# Patient Record
Sex: Female | Born: 1960
Health system: Southern US, Community
[De-identification: ages and names within clinical notes are randomized; demographics above are authoritative.]

## PROBLEM LIST (undated history)

## (undated) DIAGNOSIS — E538 Deficiency of other specified B group vitamins: Secondary | ICD-10-CM

## (undated) DIAGNOSIS — L719 Rosacea, unspecified: Secondary | ICD-10-CM

## (undated) DIAGNOSIS — K224 Dyskinesia of esophagus: Secondary | ICD-10-CM

## (undated) DIAGNOSIS — F419 Anxiety disorder, unspecified: Secondary | ICD-10-CM

## (undated) DIAGNOSIS — K635 Polyp of colon: Secondary | ICD-10-CM

## (undated) DIAGNOSIS — T7840XA Allergy, unspecified, initial encounter: Secondary | ICD-10-CM

## (undated) HISTORY — DX: Polyp of colon: K63.5

## (undated) HISTORY — DX: Allergy, unspecified, initial encounter: T78.40XA

## (undated) HISTORY — DX: Anxiety disorder, unspecified: F41.9

## (undated) HISTORY — DX: Dyskinesia of esophagus: K22.4

## (undated) HISTORY — DX: Deficiency of other specified B group vitamins: E53.8

## (undated) HISTORY — DX: Rosacea, unspecified: L71.9

---

## 2000-02-29 HISTORY — PX: ANAL FISSURECTOMY: SUR608

## 2006-06-20 ENCOUNTER — Other Ambulatory Visit: Admission: RE | Admit: 2006-06-20 | Discharge: 2006-06-20 | Payer: Self-pay | Admitting: *Deleted

## 2007-06-18 ENCOUNTER — Other Ambulatory Visit: Admission: RE | Admit: 2007-06-18 | Discharge: 2007-06-18 | Payer: Self-pay | Admitting: *Deleted

## 2007-07-05 ENCOUNTER — Encounter: Admission: RE | Admit: 2007-07-05 | Discharge: 2007-07-05 | Payer: Self-pay | Admitting: *Deleted

## 2008-07-07 ENCOUNTER — Encounter: Admission: RE | Admit: 2008-07-07 | Discharge: 2008-07-07 | Payer: Self-pay | Admitting: Obstetrics and Gynecology

## 2008-07-07 LAB — HM MAMMOGRAPHY: HM Mammogram: NEGATIVE

## 2009-07-09 ENCOUNTER — Encounter: Admission: RE | Admit: 2009-07-09 | Discharge: 2009-07-09 | Payer: Self-pay | Admitting: Obstetrics and Gynecology

## 2010-06-16 ENCOUNTER — Other Ambulatory Visit: Payer: Self-pay | Admitting: Obstetrics and Gynecology

## 2010-06-16 DIAGNOSIS — Z1231 Encounter for screening mammogram for malignant neoplasm of breast: Secondary | ICD-10-CM

## 2010-07-14 ENCOUNTER — Other Ambulatory Visit: Payer: Self-pay

## 2010-07-14 ENCOUNTER — Ambulatory Visit
Admission: RE | Admit: 2010-07-14 | Discharge: 2010-07-14 | Disposition: A | Discharge status: Home / Self Care | Payer: BC Managed Care – PPO | Source: Ambulatory Visit | Attending: Obstetrics and Gynecology | Admitting: Obstetrics and Gynecology

## 2010-07-14 DIAGNOSIS — Z1231 Encounter for screening mammogram for malignant neoplasm of breast: Secondary | ICD-10-CM

## 2010-07-21 ENCOUNTER — Encounter: Payer: Self-pay | Admitting: *Deleted

## 2010-07-21 ENCOUNTER — Encounter: Payer: Self-pay | Admitting: Family Medicine

## 2010-07-21 ENCOUNTER — Ambulatory Visit (INDEPENDENT_AMBULATORY_CARE_PROVIDER_SITE_OTHER): Payer: BC Managed Care – PPO | Admitting: Family Medicine

## 2010-07-21 VITALS — BP 108/68 | HR 80 | Ht 66.0 in | Wt 125.0 lb

## 2010-07-21 DIAGNOSIS — L719 Rosacea, unspecified: Secondary | ICD-10-CM | POA: Insufficient documentation

## 2010-07-21 DIAGNOSIS — Z0000 Encounter for general adult medical examination without abnormal findings: Secondary | ICD-10-CM

## 2010-07-21 DIAGNOSIS — F17200 Nicotine dependence, unspecified, uncomplicated: Secondary | ICD-10-CM

## 2010-07-21 DIAGNOSIS — J309 Allergic rhinitis, unspecified: Secondary | ICD-10-CM | POA: Insufficient documentation

## 2010-07-21 LAB — LIPID PANEL
Cholesterol: 180 mg/dL (ref 0–200)
HDL: 50 mg/dL (ref >39)
Total CHOL/HDL Ratio: 3.6 Ratio
VLDL: 18 mg/dL (ref 0–40)

## 2010-07-21 LAB — GLUCOSE, RANDOM: Glucose, Bld: 79 mg/dL (ref 70–99)

## 2010-07-21 LAB — POCT URINALYSIS DIPSTICK
Blood, UA: NEGATIVE
Leukocytes, UA: NEGATIVE
Nitrite, UA: NEGATIVE
Protein, UA: NEGATIVE
Urobilinogen, UA: NEGATIVE

## 2010-07-21 NOTE — Progress Notes (Signed)
Subjective:    Patient ID: Lindsay Chang, female    DOB: 08/25/1960, 50 y.o.   MRN: [id number]  HPI Lindsay Chang is a 50 y.o. female who presents for a complete physical. She sees Dr. Billy Coast for her GYN care, due again in July. She has the following concerns:  Intermittent night sweats; periods will get irregular for a few months, and then back to normal, monthly again for the last 4 months (went 4 months without a cycle, associated with increased night sweats)  Would like to have lipids checked.  Hasn't eaten in 6 hours.  Had CBC, Cmet, Vitamin D, TSH in 10/2008 which was normal. No lipids done then.  FSH was elevated at the time.    Immunization History  Administered Date(s) Administered  . 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 Allergy   . Anxiety   . Rosacea     Dr. Emily Filbert    Past Surgical History  Procedure Date  . Rectal fissure     History   Social History  . Marital Status: Married    Spouse Name: N/A    Number of Children: 2  . Years of Education: N/A   Occupational History  . Yoga instructor    Social History Main Topics  . Smoking status: Current Everyday Smoker -- 0.5 packs/day  . Smokeless tobacco: Never Used  . Alcohol Use: 3.0 oz/week    5 Glasses of wine per week     a glass of wine most nights  . Drug Use: No  . Sexually Active: Yes    Birth Control/ Protection: Other-see comments     husband with vasectomy   Other Topics Concern  . Not on file   Social History Narrative  . No narrative on file    Family History  Problem Relation Age of Onset  . Breast cancer Mother   . Graves' disease Mother   . Hyperlipidemia Mother   . Hypertension Father   . Anxiety disorder Father   .  Thyroid disease Brother   . Colon cancer Maternal Grandmother   . Aortic aneurysm Maternal Grandfather   . Lung cancer Maternal Grandfather   . Diabetes Neg Hx     Current outpatient prescriptions:AVAR LS CLEANSER 10-2 % LIQD, , Disp: , Rfl: ;  cetirizine (ZYRTEC) 10 MG tablet, Take 10 mg by mouth daily.  , Disp: , Rfl: ;  FINACEA 15 % cream, , Disp: , Rfl: ;  ibuprofen (ADVIL,MOTRIN) 200 MG tablet, Take 200 mg by mouth as needed.  , Disp: , Rfl: ;  Multiple Vitamin (MULTI-VITAMIN) tablet, Take 1 tablet by mouth daily.  , Disp: , Rfl:   No Known Allergies  Review of Systems The patient denies anorexia, fever, weight changes, headaches,  vision changes, decreased hearing, ear pain, sore throat, breast concerns, chest pain, syncope, dyspnea on exertion, cough, swelling, nausea, vomiting, diarrhea, constipation, abdominal pain, melena, hematochezia, indigestion/heartburn, hematuria, incontinence, dysuria, irregular menstrual cycles, vaginal discharge, odor or itch, genital lesions, joint pains, numbness, tingling, weakness, tremor, suspicious skin lesions, depression, abnormal bleeding/bruising, or enlarged lymph nodes.  +ROS: occasional heartburn; occasionally feels dizzy. Has had low iron levels and some low BP in past. Denies recent symptoms of anemia, dizziness.  Sees dermatologist regularly for skin checks and treatment of rosacea.  Some anxiety, worries a lot.  Never took medications for anxiety.  Has some palpitations related to anxiety with a negative w/u for palpitations in the past    Objective:   Physical Exam BP 108/68  Pulse 80  Ht 5\' 6"  (1.676 m)  Wt 125 lb (56.7 kg)  BMI 20.18 kg/m2  LMP 06/22/2010  General Appearance:    Alert, cooperative, no distress, appears stated age  Head:    Normocephalic, without obvious abnormality, atraumatic  Eyes:    PERRL, conjunctiva/corneas clear, EOM's intact, fundi    benign  Ears:    Normal TM's and external ear canals  Nose:   Nares  normal, mucosa normal, no drainage or sinus   tenderness  Throat:   Lips, mucosa, and tongue normal; teeth and gums normal  Neck:   Supple, no lymphadenopathy;  thyroid:  no   enlargement/tenderness/nodules; no carotid   bruit or JVD  Back:    Spine nontender, no curvature, ROM normal, no CVA     tenderness  Lungs:     Clear to auscultation bilaterally without wheezes, rales or     ronchi; respirations unlabored  Chest Wall:    No tenderness or deformity   Heart:    Regular rate and rhythm, S1 and S2 normal, no murmur, rub   or gallop  Breast Exam:    Deferred to GYN  Abdomen:     Soft, non-tender, nondistended, normoactive bowel sounds,    no masses, no hepatosplenomegaly  Genitalia:    Deferred to GYN     Extremities:   No clubbing, cyanosis or edema  Pulses:   2+ and symmetric all extremities  Skin:   Skin color, texture, turgor normal, no rashes or lesions  Lymph nodes:   Cervical, and supraclavicular nodes normal  Neurologic:   CNII-XII intact, normal strength, sensation and gait; reflexes 2+ and symmetric throughout          Psych:   Normal mood, affect, hygiene and grooming.           Assessment & Plan:   1. Routine general medical examination at a health care facility  POCT urinalysis dipstick, Visual acuity screening, Lipid panel, Glucose, random  2. Allergic rhinitis, cause unspecified     seasonal, controlled with Zyrtec prn  3. Tobacco use disorder      Discussed monthly self breast exams and yearly mammograms after the age of 50; at least 30 minutes of aerobic activity at least 5 days/week; proper sunscreen use reviewed; healthy diet, including goals of calcium and vitamin D intake and alcohol recommendations (less than or equal to 1 drink/day) reviewed; regular seatbelt use; changing batteries in smoke detectors.  Immunization recommendations discussed.  Colonoscopy recommendations reviewed  Patient was encouraged to quit smoking.  Discussed risks of smoking.   Instructed to start thinking about why/when patient smokes in order to come up with effective strategies to cut back or quit. Discussed available resources, including free counseling through [place] [facility], smoking cessation classes through [facility], OTC nicotine replacements, and the possibility of assistance with prescription medication if patients own strategies fail and if patient is motivated to quit.

## 2010-07-22 ENCOUNTER — Encounter: Payer: Self-pay | Admitting: Family Medicine

## 2010-11-30 ENCOUNTER — Other Ambulatory Visit (INDEPENDENT_AMBULATORY_CARE_PROVIDER_SITE_OTHER): Payer: BC Managed Care – PPO

## 2010-11-30 DIAGNOSIS — Z23 Encounter for immunization: Secondary | ICD-10-CM

## 2011-02-09 ENCOUNTER — Ambulatory Visit (INDEPENDENT_AMBULATORY_CARE_PROVIDER_SITE_OTHER): Payer: BC Managed Care – PPO | Admitting: Family Medicine

## 2011-02-09 ENCOUNTER — Encounter: Payer: Self-pay | Admitting: Family Medicine

## 2011-02-09 ENCOUNTER — Other Ambulatory Visit: Payer: Self-pay | Admitting: Family Medicine

## 2011-02-09 DIAGNOSIS — L309 Dermatitis, unspecified: Secondary | ICD-10-CM

## 2011-02-09 DIAGNOSIS — K5900 Constipation, unspecified: Secondary | ICD-10-CM

## 2011-02-09 DIAGNOSIS — K644 Residual hemorrhoidal skin tags: Secondary | ICD-10-CM

## 2011-02-09 DIAGNOSIS — L259 Unspecified contact dermatitis, unspecified cause: Secondary | ICD-10-CM

## 2011-02-09 DIAGNOSIS — F17200 Nicotine dependence, unspecified, uncomplicated: Secondary | ICD-10-CM

## 2011-02-09 DIAGNOSIS — N926 Irregular menstruation, unspecified: Secondary | ICD-10-CM

## 2011-02-09 LAB — CBC WITH DIFFERENTIAL/PLATELET
Basophils Absolute: 0 10*3/uL (ref 0.0–0.1)
Basophils Relative: 1 % (ref 0–1)
MCHC: 34.5 g/dL (ref 30.0–36.0)
MCV: 102.5 fL — ABNORMAL HIGH (ref 78.0–100.0)
Monocytes Absolute: 0.4 10*3/uL (ref 0.1–1.0)
Monocytes Relative: 8 % (ref 3–12)
Platelets: 317 10*3/uL (ref 150–400)

## 2011-02-09 LAB — TSH: TSH: 2.183 u[IU]/mL (ref 0.350–4.500)

## 2011-02-09 MED ORDER — HYDROCORTISONE 2.5 % RE CREA: TOPICAL_CREAM | RECTAL | Status: DC

## 2011-02-09 NOTE — Progress Notes (Signed)
Chief complaints:  spots on her legs from knee down, only after wearing heels and on feet for long periods of time-lasts for about 3 days. Also having digestive issues. Has some questions related to hormone levels checked by GYN-Dr.Taavon  HPI:   Spots on legs:  First noted 2 years ago, after wearing heels all night for a Christmas party.  Noted blood spots on her lower legs, bilaterally; resolved after 3 days.  Similar episode 3 months later after standing a lot in heels--noticed same spots the next day.  Spring break, skiing in [place], after wearing ski boots which were tight, noticed similar spots, along with discomfort, and ankle swelling.  This also cleared up within a few days.  Has now occurred  3 times in the last 2 months.  Felt legs getting a little tingly and soreness in her gastroc muscles at the same time that the rash starts.  Rash never worsens after first seeing it in the morning, and lasts for 2 days.  She mentioned to Dr. Emily Filbert who suggested pigmented purpura or vasculitis (she showed her a picture of the rash she took with her phone--she no longer has this picture available for me to see).  This is driving her nuts, and wants to find out what it is.  GI changes in the last 2-3 weeks.  Alternating between constipation and frequent stools--first of which is normal, second and/or third are looser.  Hemorrhoids have been flaring, bleeding.  Not eating breakfast like she used to, and dinner is very rushed--more fast foods, not as much vegetables in her diet.  Also used to have Activia every morning, and no longer is eating that.  Preparation H helps for hemorrhoids, and suppositories and seems to work okay, but it seems to take a while to help.  Dr. Billy Coast had hormone levels checked, and FSH was reportedly 50.  Hadn't had a period since May, until 2 weeks ago.  Had regular periods from January through May; [facility] was checked in May, elevated, and no period again until 2 weeks ago.  Felt just like  a regular period with cramps and clots, lasted 5 days, and no bleeding since.   Past Medical History  Diagnosis Date  . Allergy   . Anxiety   . Rosacea     Dr. Emily Filbert    Past Surgical History  Procedure Date  . Rectal fissure     History   Social History  . Marital Status: Married    Spouse Name: N/A    Number of Children: 2  . Years of Education: N/A   Occupational History  . Yoga instructor    Social History Main Topics  . Smoking status: Current Everyday Smoker -- 0.3 packs/day  . Smokeless tobacco: Never Used   Comment: quit date of Christmas cruise (using e-cigarette)  . Alcohol Use: 3.0 oz/week    5 Glasses of wine per week     a glass of wine 3-4 nights.  . Drug Use: No  . Sexually Active: Yes    Birth Control/ Protection: Other-see comments     husband with vasectomy   Other Topics Concern  . Not on file   Social History Narrative   Wadie Lessen daughter    Family History  Problem Relation Age of Onset  . Breast cancer Mother   . Graves' disease Mother   . Hyperlipidemia Mother   . Hypertension Father   . Anxiety disorder Father   . Thyroid disease Brother   .  Colon cancer Maternal Grandmother   . Aortic aneurysm Maternal Grandfather   . Lung cancer Maternal Grandfather   . Diabetes Neg Hx    Current Outpatient Prescriptions on File Prior to Visit  Medication Sig Dispense Refill  . AVAR LS CLEANSER 10-2 % LIQD       . FINACEA 15 % cream       . ibuprofen (ADVIL,MOTRIN) 200 MG tablet Take 200 mg by mouth as needed.        . Multiple Vitamin (MULTI-VITAMIN) tablet Take 1 tablet by mouth daily.        . cetirizine (ZYRTEC) 10 MG tablet Take 10 mg by mouth daily.         No Known Allergies  ROS: Denies fevers, URI symptoms, cough, shortness of breath, chest pain, joint pains, other skin rashes.  No urinary complaints.  See HPI  PHYSICAL EXAM: BP 102/62  Pulse 68  Ht 5\' 6"  (1.676 m)  Wt 123 lb (55.792 kg)  BMI 19.85 kg/m2  LMP  01/26/2011 Well developed, pleasant female in no distress Neck: no lymphadenopathy or thyromegaly Heart: regular rate and rhythm without murmur Lungs: clear bilaterally Abdomen: soft, nontender, no organomegaly or mass Extremities:  No clubbing, cyanosis or edema.  2+ pulses.  No skin lesions or rash Psych: normal mood, affect, hygiene and grooming  ASSESSMENT/PLAN: 1. Dermatitis  CBC with Differential, Sedimentation rate, Antinuclear Antib (ANA)  2. External hemorrhoid  hydrocortisone (ANUSOL-HC) 2.5 % rectal cream  3. Constipation  TSH  4. Irregular menses  FSH   Rash--? Etiology.  Per Dr. Emily Filbert who saw picture of it from patient's phone, possible pigmented purpura (pt looked up and said those spots were much larger than hers) or vasculitis.  Check ANA and sed rate. Also check TSH given constipation and rash.  Constipation--go back to Activia or other probiotic.  Work on high [employer], and ensure drinking adequate water  Irregular menses, with high FSH.  Check FSH again today.  If remains high, and has had ongoing bleeding, she needs to f/u with Dr. Billy Coast for possible ultrasound and/or biopsy  Tobacco use--strongly encouraged tobacco cessation.     SEND COPIES OF LABS TO DR Billy Coast AND DR Emily Filbert  F/u prn

## 2011-02-09 NOTE — Patient Instructions (Signed)
Quit smoking. Restart Activia or other probiotic High fiber diet (either through fiber cereals, bars, fruits, vegetables or other fiber supplements).  Ensure you're drinking plenty of water.  Follow up with Dr. Emily Filbert if/when rash recurs if we aren't able to make any diagnosis through bloodwork

## 2011-02-10 ENCOUNTER — Other Ambulatory Visit: Payer: Self-pay | Admitting: *Deleted

## 2011-02-10 ENCOUNTER — Encounter: Payer: Self-pay | Admitting: Family Medicine

## 2011-02-10 DIAGNOSIS — D510 Vitamin B12 deficiency anemia due to intrinsic factor deficiency: Secondary | ICD-10-CM | POA: Insufficient documentation

## 2011-02-10 DIAGNOSIS — E538 Deficiency of other specified B group vitamins: Secondary | ICD-10-CM

## 2011-02-10 DIAGNOSIS — R768 Other specified abnormal immunological findings in serum: Secondary | ICD-10-CM | POA: Insufficient documentation

## 2011-02-10 LAB — FOLATE: Folate: >20 ng/mL

## 2011-02-10 LAB — ANTI-NUCLEAR AB-TITER (ANA TITER)

## 2011-02-11 ENCOUNTER — Other Ambulatory Visit: Payer: BC Managed Care – PPO

## 2011-02-11 DIAGNOSIS — E538 Deficiency of other specified B group vitamins: Secondary | ICD-10-CM

## 2011-02-11 MED ORDER — CYANOCOBALAMIN 1000 MCG/ML IJ SOLN
1000.0000 ug | Freq: Once | INTRAMUSCULAR | Status: AC
  Administered 2011-02-11: 1000 ug via INTRAMUSCULAR

## 2011-02-18 ENCOUNTER — Other Ambulatory Visit: Payer: BC Managed Care – PPO

## 2011-02-18 DIAGNOSIS — E538 Deficiency of other specified B group vitamins: Secondary | ICD-10-CM

## 2011-02-18 MED ORDER — CYANOCOBALAMIN 1000 MCG/ML IJ SOLN
1000.0000 ug | Freq: Once | INTRAMUSCULAR | Status: AC
  Administered 2011-02-18: 1000 ug via INTRAMUSCULAR

## 2011-03-04 ENCOUNTER — Other Ambulatory Visit (INDEPENDENT_AMBULATORY_CARE_PROVIDER_SITE_OTHER): Payer: BC Managed Care – PPO

## 2011-03-04 DIAGNOSIS — E538 Deficiency of other specified B group vitamins: Secondary | ICD-10-CM

## 2011-03-04 MED ORDER — CYANOCOBALAMIN 1000 MCG/ML IJ SOLN
1000.0000 ug | Freq: Once | INTRAMUSCULAR | Status: AC
  Administered 2011-03-04: 1000 ug via INTRAMUSCULAR

## 2011-03-11 ENCOUNTER — Other Ambulatory Visit (INDEPENDENT_AMBULATORY_CARE_PROVIDER_SITE_OTHER): Payer: BC Managed Care – PPO

## 2011-03-11 ENCOUNTER — Other Ambulatory Visit: Payer: BC Managed Care – PPO

## 2011-03-11 DIAGNOSIS — E538 Deficiency of other specified B group vitamins: Secondary | ICD-10-CM

## 2011-03-11 MED ORDER — CYANOCOBALAMIN 1000 MCG/ML IJ SOLN
1000.0000 ug | Freq: Once | INTRAMUSCULAR | Status: AC
  Administered 2011-03-11: 1000 ug via INTRAMUSCULAR

## 2011-03-18 ENCOUNTER — Other Ambulatory Visit: Payer: BC Managed Care – PPO

## 2011-03-18 ENCOUNTER — Other Ambulatory Visit: Payer: Self-pay | Admitting: Family Medicine

## 2011-03-18 DIAGNOSIS — E538 Deficiency of other specified B group vitamins: Secondary | ICD-10-CM

## 2011-03-19 LAB — CBC WITH DIFFERENTIAL/PLATELET
Basophils Absolute: 0 10*3/uL (ref 0.0–0.1)
Lymphocytes Relative: 22 % (ref 12–46)
Lymphs Abs: 1.3 10*3/uL (ref 0.7–4.0)
MCH: 37.1 pg — ABNORMAL HIGH (ref 26.0–34.0)
MCHC: 35.2 g/dL (ref 30.0–36.0)
MCV: 105.5 fL — ABNORMAL HIGH (ref 78.0–100.0)
Monocytes Absolute: 0.4 10*3/uL (ref 0.1–1.0)
Neutro Abs: 4.2 10*3/uL (ref 1.7–7.7)
Platelets: 329 10*3/uL (ref 150–400)
RDW: 15.3 % (ref 11.5–15.5)

## 2011-03-21 LAB — IRON: Iron: 67 ug/dL (ref 42–145)

## 2011-03-21 LAB — FERRITIN: Ferritin: 57 ng/mL (ref 10–291)

## 2011-03-24 ENCOUNTER — Other Ambulatory Visit: Payer: Self-pay | Admitting: *Deleted

## 2011-03-24 DIAGNOSIS — D649 Anemia, unspecified: Secondary | ICD-10-CM

## 2011-03-25 ENCOUNTER — Other Ambulatory Visit: Payer: BC Managed Care – PPO

## 2011-03-25 DIAGNOSIS — E538 Deficiency of other specified B group vitamins: Secondary | ICD-10-CM

## 2011-03-25 MED ORDER — CYANOCOBALAMIN 1000 MCG/ML IJ SOLN
1000.0000 ug | Freq: Once | INTRAMUSCULAR | Status: AC
  Administered 2011-03-25: 1000 ug via INTRAMUSCULAR

## 2011-04-08 ENCOUNTER — Other Ambulatory Visit (INDEPENDENT_AMBULATORY_CARE_PROVIDER_SITE_OTHER): Payer: BC Managed Care – PPO

## 2011-04-08 DIAGNOSIS — E538 Deficiency of other specified B group vitamins: Secondary | ICD-10-CM

## 2011-04-08 MED ORDER — CYANOCOBALAMIN 1000 MCG/ML IJ SOLN
1000.0000 ug | Freq: Once | INTRAMUSCULAR | Status: AC
  Administered 2011-04-08: 1000 ug via INTRAMUSCULAR

## 2011-04-18 ENCOUNTER — Encounter: Payer: Self-pay | Admitting: Internal Medicine

## 2011-04-22 ENCOUNTER — Other Ambulatory Visit: Payer: BC Managed Care – PPO

## 2011-04-22 ENCOUNTER — Other Ambulatory Visit: Payer: Self-pay | Admitting: Family Medicine

## 2011-04-22 DIAGNOSIS — D649 Anemia, unspecified: Secondary | ICD-10-CM

## 2011-04-23 LAB — CBC WITH DIFFERENTIAL/PLATELET
Basophils Absolute: 0 10*3/uL (ref 0.0–0.1)
Eosinophils Absolute: 0.1 10*3/uL (ref 0.0–0.7)
Hemoglobin: 10.8 g/dL — ABNORMAL LOW (ref 12.0–15.0)
Platelets: 328 10*3/uL (ref 150–400)
RDW: 14 % (ref 11.5–15.5)
WBC: 6.5 10*3/uL (ref 4.0–10.5)

## 2011-05-04 ENCOUNTER — Encounter: Payer: Self-pay | Admitting: Family Medicine

## 2011-05-04 ENCOUNTER — Ambulatory Visit (INDEPENDENT_AMBULATORY_CARE_PROVIDER_SITE_OTHER): Payer: BC Managed Care – PPO | Admitting: Family Medicine

## 2011-05-04 DIAGNOSIS — F17200 Nicotine dependence, unspecified, uncomplicated: Secondary | ICD-10-CM

## 2011-05-04 DIAGNOSIS — D510 Vitamin B12 deficiency anemia due to intrinsic factor deficiency: Secondary | ICD-10-CM

## 2011-05-04 DIAGNOSIS — J309 Allergic rhinitis, unspecified: Secondary | ICD-10-CM

## 2011-05-04 DIAGNOSIS — D7589 Other specified diseases of blood and blood-forming organs: Secondary | ICD-10-CM

## 2011-05-04 MED ORDER — CYANOCOBALAMIN 1000 MCG/ML IJ SOLN
1000.0000 ug | Freq: Once | INTRAMUSCULAR | Status: AC
  Administered 2011-05-04: 1000 ug via INTRAMUSCULAR

## 2011-05-04 NOTE — Progress Notes (Signed)
Patient presents to discuss her lab results.  She was diagnosed with B12 deficiency and treated with B12 shots.  B12 levels have normalized, but anemia hasn't improved, and MCV continues to rise.   She was having heavy periods, bled every 2 weeks x 3 in December and January.  Last period was January 1st.  Last period prior to December was in August. She had iron levels checked earlier this year and was normal (in January).  Increased alcohol some--drinking 1-2 glasses of wine daily, which is a little more than previously.  She is complaining of sinus pressure behind eyes and cheeks. +runny nose, some ear discomfort.  Mucus is clear. Restarted taking Zyrtec nightly, which helps some, but still having head pressure. She describes "swollen veins" in her upper jaw which sometimes feels sore.  Denies teeth pain.  No further issue with rash on lower extremities, since getting B12 treatment.  Past Medical History  Diagnosis Date  . Allergy   . Anxiety   . Rosacea     Dr. Emily Filbert    Past Surgical History  Procedure Date  . Rectal fissure     History   Social History  . Marital Status: Married    Spouse Name: N/A    Number of Children: 2  . Years of Education: N/A   Occupational History  . Yoga instructor    Social History Main Topics  . Smoking status: Current Everyday Smoker -- 0.3 packs/day  . Smokeless tobacco: Never Used   Comment: quit date of Christmas cruise (using e-cigarette)  . Alcohol Use: 3.0 oz/week    5 Glasses of wine per week     1-2 glasses of wine nightly.  . Drug Use: No  . Sexually Active: Yes    Birth Control/ Protection: Other-see comments     husband with vasectomy   Other Topics Concern  . Not on file   Social History Narrative   Wadie Lessen daughter    Family History  Problem Relation Age of Onset  . Breast cancer Mother   . Graves' disease Mother   . Hyperlipidemia Mother   . Hypertension Father   . Anxiety disorder Father   . Thyroid  disease Brother   . Colon cancer Maternal Grandmother   . Aortic aneurysm Maternal Grandfather   . Lung cancer Maternal Grandfather   . Diabetes Neg Hx     Current outpatient prescriptions:AVAR LS CLEANSER 10-2 % LIQD, , Disp: , Rfl: ;  cetirizine (ZYRTEC) 10 MG tablet, Take 10 mg by mouth daily.  , Disp: , Rfl: ;  FINACEA 15 % cream, , Disp: , Rfl: ;  ibuprofen (ADVIL,MOTRIN) 200 MG tablet, Take 200 mg by mouth as needed.  , Disp: , Rfl: ;  Multiple Vitamin (MULTI-VITAMIN) tablet, Take 1 tablet by mouth daily.  , Disp: , Rfl:  hydrocortisone (ANUSOL-HC) 2.5 % rectal cream, Apply rectally 2 times daily, Disp: 30 g, Rfl: 1 Current facility-administered medications:cyanocobalamin ((VITAMIN B-12)) injection 1,000 mcg, 1,000 mcg, Intramuscular, Once, Lavonda Jumbo, MD, 1,000 mcg at 05/04/11 1048  No Known Allergies  ROS:  Denies fevers, purulent drainage, headaches, dizziness, chest pain, nausea, vomiting, diarrhea.  No further skin rash. Normal moods. Occasionally sneaks cigarette.  Mainly using e-cigarette.  PHYSICAL EXAM: BP 100/64  Pulse 72  Ht 5\' 6"  (1.676 m)  Wt 124 lb (56.246 kg)  BMI 20.01 kg/m2  LMP 03/01/2011 Well developed, pleasant female in no distress HEENT:  PERRL, sclera anicteric, no conjunctival injection Nose--mild  edema with clear-white mucus.  Sinuses nontender.  OP clear Slight thickening of buccal mucosa, but doesn't appear to be a vein, nontender. Neck: no lymphadenopathy, thyromegaly or mass Heart: regular rate and rhythm without murmur Lungs: clear bilaterally Skin: no rash Psych: normal mood, affect, hygiene and grooming  ASSESSMENT/PLAN: 1. Tobacco use disorder    2. Pernicious anemia  cyanocobalamin ((VITAMIN B-12)) injection 1,000 mcg, CBC with Differential, Vitamin B12  3. Macrocytosis  CBC with Differential  4. Allergic rhinitis, cause unspecified     Encouraged complete cessation from smoking. Discussed trying to get her husband to also quit so that  cigarettes aren't accessible to her.  Discussed need to break the hand-mouth habit with the e-cigarette.  B12 deficiency.  Levels are now normal, but persistent anemia and macrocytosis.  Review of [facility] labs showed MCV of 99, normal Hg at that time. Discussed oral vs injections of B12. Prefers shots.  Will try and cut back on wine.  Recheck CBC and B12 level in 2-3 months . If still not improving, consider hematology consult, especially if ongoing anemia with it.  Allergies--continue Zyrtec.  Trial of sinus rinses or neti-pot.  She doesn't like to take decongestants.  Consider nasal steroids if not improving with these measures.  Discussed how to take, and can call for Flonase Rx if needed.  b12 today and monthly  Check labs in 3 months, prior to injection

## 2011-05-04 NOTE — Patient Instructions (Signed)
Cut back on alcohol intake. Continue to work on quitting smoking entirely  Return in 1 and 2 months for B12 shots, and then in 3 months for labs, followed by B12 shot  If ongoing large blood cells and anemia, despite B12 being adequately replaced, I will refer you to hematologist.  Let me know if having ongoing heavy/irregular bleeding, as then I might also want to check your iron levels again, to see if contributing to anemia (was normal in January)

## 2011-06-03 ENCOUNTER — Other Ambulatory Visit (INDEPENDENT_AMBULATORY_CARE_PROVIDER_SITE_OTHER): Payer: BC Managed Care – PPO

## 2011-06-03 DIAGNOSIS — E538 Deficiency of other specified B group vitamins: Secondary | ICD-10-CM

## 2011-06-03 MED ORDER — CYANOCOBALAMIN 1000 MCG/ML IJ SOLN
1000.0000 ug | Freq: Once | INTRAMUSCULAR | Status: AC
  Administered 2011-06-03: 1000 ug via INTRAMUSCULAR

## 2011-06-22 ENCOUNTER — Other Ambulatory Visit: Payer: Self-pay | Admitting: Obstetrics and Gynecology

## 2011-06-22 DIAGNOSIS — Z1231 Encounter for screening mammogram for malignant neoplasm of breast: Secondary | ICD-10-CM

## 2011-07-18 ENCOUNTER — Ambulatory Visit
Admission: RE | Admit: 2011-07-18 | Discharge: 2011-07-18 | Disposition: A | Discharge status: Home / Self Care | Payer: BC Managed Care – PPO | Source: Ambulatory Visit | Attending: Obstetrics and Gynecology | Admitting: Obstetrics and Gynecology

## 2011-07-18 DIAGNOSIS — Z1231 Encounter for screening mammogram for malignant neoplasm of breast: Secondary | ICD-10-CM

## 2011-07-31 ENCOUNTER — Emergency Department (HOSPITAL_COMMUNITY)
Admission: EM | Admit: 2011-07-31 | Discharge: 2011-07-31 | Disposition: A | Discharge status: Home / Self Care | Payer: BC Managed Care – PPO | Attending: Emergency Medicine | Admitting: Emergency Medicine

## 2011-07-31 ENCOUNTER — Encounter (HOSPITAL_COMMUNITY): Payer: Self-pay | Admitting: *Deleted

## 2011-07-31 DIAGNOSIS — Z79899 Other long term (current) drug therapy: Secondary | ICD-10-CM | POA: Insufficient documentation

## 2011-07-31 DIAGNOSIS — F17200 Nicotine dependence, unspecified, uncomplicated: Secondary | ICD-10-CM | POA: Insufficient documentation

## 2011-07-31 DIAGNOSIS — K644 Residual hemorrhoidal skin tags: Secondary | ICD-10-CM

## 2011-07-31 MED ORDER — HYDROCORTISONE ACETATE 25 MG RE SUPP
25.0000 mg | Freq: Once | RECTAL | Status: AC
  Administered 2011-07-31: 25 mg via RECTAL
  Filled 2011-07-31: qty 1

## 2011-07-31 MED ORDER — DOCUSATE SODIUM 100 MG PO CAPS
100.0000 mg | ORAL_CAPSULE | Freq: Once | ORAL | Status: AC
  Administered 2011-07-31: 100 mg via ORAL
  Filled 2011-07-31: qty 1

## 2011-07-31 MED ORDER — POLYETHYLENE GLYCOL 3350 17 G PO PACK
17.0000 g | PACK | Freq: Every day | ORAL | Status: DC
  Administered 2011-07-31: 17 g via ORAL
  Filled 2011-07-31: qty 1

## 2011-07-31 NOTE — ED Notes (Signed)
PT states understanding of discharge instructions 

## 2011-07-31 NOTE — Discharge Instructions (Signed)
Return to the hospital for severe or worsening symptoms, called the surgeon in the morning for followup this week. Please use the suppository as prescribed, you may use this in conjunction with a topical cream to your provider as prescribed as well. Keep her stools soft to diarrhea with the medications provided

## 2011-07-31 NOTE — ED Notes (Signed)
The pt has pain in her rectum and she has a throbbing painful swollen hemorrhoid since last pm.  Much worse tonight

## 2011-07-31 NOTE — ED Notes (Signed)
Miller MD at bedside. 

## 2011-07-31 NOTE — ED Provider Notes (Signed)
History     CSN: [id number]  Arrival date & time 07/31/11  0141   First MD Initiated Contact with Patient 07/31/11 0236      Chief Complaint  Patient presents with  . poss thrombosed henorrhoid     (Consider location/radiation/quality/duration/timing/severity/associated sxs/prior treatment) HPI Comments: 51 year old female with a history of intermittent hemorrhoids since having children, states that over last 24 hours she has developed increased swelling and pain in her rectal area. This is persistent, moderate, worse with palpation, not associated with bleeding or constipation. She states that her spouse looked at this area and found there to be a large protruding hemorrhoid type mass.  The history is provided by the patient and the spouse.    Past Medical History  Diagnosis Date  . Allergy   . Anxiety   . Rosacea     Dr. Emily Filbert    Past Surgical History  Procedure Date  . Rectal fissure     Family History  Problem Relation Age of Onset  . Breast cancer Mother   . Graves' disease Mother   . Hyperlipidemia Mother   . Hypertension Father   . Anxiety disorder Father   . Thyroid disease Brother   . Colon cancer Maternal Grandmother   . Aortic aneurysm Maternal Grandfather   . Lung cancer Maternal Grandfather   . Diabetes Neg Hx     History  Substance Use Topics  . Smoking status: Current Everyday Smoker -- 0.3 packs/day  . Smokeless tobacco: Never Used   Comment: quit date of Christmas cruise (using e-cigarette)  . Alcohol Use: 3.0 oz/week    5 Glasses of wine per week     1-2 glasses of wine nightly.    OB History    Grav Para Term Preterm Abortions TAB SAB Ect Mult Living                  Review of Systems  Constitutional: Negative for fever and chills.  Gastrointestinal: Positive for rectal pain. Negative for nausea, vomiting, abdominal pain, constipation and blood in stool.  Skin: Negative for rash.    Allergies  Review of patient's allergies  indicates no known allergies.  Home Medications   Current Outpatient Rx  Name Route Sig Dispense Refill  . CETIRIZINE HCL 10 MG PO TABS Oral Take 10 mg by mouth daily.      Marland Kitchen FINACEA 15 % EX GEL Topical Apply 1 application topically daily as needed. For rosacea flare ups    . OMEGA-3 FATTY ACIDS 1000 MG PO CAPS Oral Take 1 g by mouth daily.    Marland Kitchen HYDROCORTISONE 2.5 % RE CREA  Apply rectally 2 times daily 30 g 1  . HYPROMELLOSE 2.5 % OP SOLN Both Eyes Place 1 drop into both eyes 4 (four) times daily as needed. Dry eyes    . IBUPROFEN 200 MG PO TABS Oral Take 200-600 mg by mouth every 6 (six) hours as needed. For headache or pain    . ADULT MULTIVITAMIN W/MINERALS CH Oral Take 1 tablet by mouth daily.    Marland Kitchen PROBIOTIC FORMULA PO CAPS Oral Take 1 capsule by mouth daily.      BP 93/72  Pulse 78  Resp 16  SpO2 100%  Physical Exam  Nursing note and vitals reviewed. Constitutional: She appears well-developed and well-nourished.  HENT:  Head: Normocephalic and atraumatic.  Eyes: No scleral icterus.  Cardiovascular: Normal rate, regular rhythm and normal heart sounds.   Pulmonary/Chest: Effort normal and breath  sounds normal. No respiratory distress.  Abdominal: Soft. Bowel sounds are normal. She exhibits no distension. There is no tenderness. There is no rebound.  Genitourinary:       Chaperone present for rectal exam, large external hemorrhoid, nonthrombosed, no rectal prolapse.  Hemorrhoid is tender to palpation  Neurological: She is alert.  Skin: Skin is warm and dry. No rash noted.    ED Course  Procedures (including critical care time)  Labs Reviewed - No data to display No results found.   1. External hemorrhoid       MDM  Large external hemorrhoid, nonthrombosed, well appearing otherwise, prescribed Anusol, Colace, MiraLAX, patient instructed to followup with general surgeon and family doctor, understanding expressed.        Vida Roller, MD 07/31/11 [phone]

## 2011-08-01 ENCOUNTER — Encounter: Payer: Self-pay | Admitting: Medical

## 2011-08-01 ENCOUNTER — Encounter (INDEPENDENT_AMBULATORY_CARE_PROVIDER_SITE_OTHER): Payer: Self-pay | Admitting: Surgery

## 2011-08-01 ENCOUNTER — Ambulatory Visit (INDEPENDENT_AMBULATORY_CARE_PROVIDER_SITE_OTHER): Payer: BC Managed Care – PPO | Admitting: Medical

## 2011-08-01 ENCOUNTER — Ambulatory Visit (INDEPENDENT_AMBULATORY_CARE_PROVIDER_SITE_OTHER): Payer: BC Managed Care – PPO | Admitting: Surgery

## 2011-08-01 ENCOUNTER — Telehealth: Payer: Self-pay | Admitting: Internal Medicine

## 2011-08-01 VITALS — BP 121/79 | HR 81 | Temp 98.4°F | Ht 67.0 in | Wt 123.2 lb

## 2011-08-01 VITALS — BP 100/70 | HR 80 | Temp 97.7°F | Resp 16 | Wt 122.0 lb

## 2011-08-01 DIAGNOSIS — K645 Perianal venous thrombosis: Secondary | ICD-10-CM

## 2011-08-01 DIAGNOSIS — K644 Residual hemorrhoidal skin tags: Secondary | ICD-10-CM

## 2011-08-01 MED ORDER — DOCUSATE SODIUM 100 MG PO CAPS: 100.0000 mg | ORAL_CAPSULE | Freq: Two times a day (BID) | ORAL | Status: AC

## 2011-08-01 MED ORDER — HYDROCORTISONE 2.5 % RE CREA: TOPICAL_CREAM | RECTAL | Status: DC

## 2011-08-01 MED ORDER — OXYCODONE-ACETAMINOPHEN 5-500 MG PO TABS: 1.0000 | ORAL_TABLET | ORAL | Status: AC | PRN

## 2011-08-01 NOTE — Telephone Encounter (Signed)
Spoke with patient and she had asked for this refill yesterday prior to seeing Lindsay Chang here, she stated that she did not this rx.

## 2011-08-01 NOTE — Patient Instructions (Signed)
For today, rest, use Epsom salt warm/hot water soaks.    Begin Colace twice daily as needed for stool softener.  You can continue the Miralax if desired.  Percocet 5/500, 1/2- 1 tablet every 4-6 hours for pain.    I have referred you to Dr. Gerrit Friends, at [facility].  Your appt is 4:30pm, but be there at 4:00pm.    [facility] Address: [address] [place], [place] [address]  [phone]

## 2011-08-01 NOTE — Telephone Encounter (Signed)
I believe she saw Lindsay Chang this morning, and is scheduled to see surgeon this afternoon for thrombosed hemorrhoid.  Does she want to wait until she sees surgeon and get rx from him? [place] [place] sending refill now, prior to surgeon's eval

## 2011-08-01 NOTE — Progress Notes (Signed)
Chief Complaint  Patient presents with  . New Evaluation    eval throm hems - referral from Dr. Joselyn Arrow    HISTORY: Patient is a 51 year old white female who presents with painful thrombosed external hemorrhoids on referral from her primary care physician's office. Patient has a history of anal fissure treated surgically in 2003.  Over the past 2 days she has noted pain and swelling. She is seen minimal bleeding. She was seen at the office of her primary care physician and diagnosed with thrombosed external hemorrhoids. She was referred to our office for surgical management.  Past Medical History  Diagnosis Date  . Allergy   . Anxiety   . Rosacea     Dr. Emily Filbert     Current Outpatient Prescriptions  Medication Sig Dispense Refill  . cetirizine (ZYRTEC) 10 MG tablet Take 10 mg by mouth daily.        Marland Kitchen docusate sodium (COLACE) 100 MG capsule Take 1 capsule (100 mg total) by mouth 2 (two) times daily.  30 capsule  0  . FINACEA 15 % cream Apply 1 application topically daily as needed. For rosacea flare ups      . fish oil-omega-3 fatty acids 1000 MG capsule Take 1 g by mouth daily.      . hydrocortisone (ANUSOL-HC) 2.5 % rectal cream Apply rectally 2 times daily  30 g  1  . hydroxypropyl methylcellulose (ISOPTO TEARS) 2.5 % ophthalmic solution Place 1 drop into both eyes 4 (four) times daily as needed. Dry eyes      . ibuprofen (ADVIL,MOTRIN) 200 MG tablet Take 200-600 mg by mouth every 6 (six) hours as needed. For headache or pain      . Multiple Vitamin (MULITIVITAMIN WITH MINERALS) TABS Take 1 tablet by mouth daily.      Marland Kitchen oxycodone-acetaminophen (ROXICET) 5-500 MG per tablet Take 1 tablet by mouth every 4 (four) hours as needed for pain.  20 tablet  0  . Probiotic Product (PROBIOTIC FORMULA) CAPS Take 1 capsule by mouth daily.         No Known Allergies   Family History  Problem Relation Age of Onset  . Breast cancer Mother   . Graves' disease Mother   . Hyperlipidemia  Mother   . Cancer Mother     breast  . Hypertension Father   . Anxiety disorder Father   . Thyroid disease Brother   . Colon cancer Maternal Grandmother   . Cancer Maternal Grandmother     colon  . Aortic aneurysm Maternal Grandfather   . Lung cancer Maternal Grandfather   . Diabetes Neg Hx      History   Social History  . Marital Status: Married    Spouse Name: N/A    Number of Children: 2  . Years of Education: N/A   Occupational History  . Yoga instructor    Social History Main Topics  . Smoking status: Current Everyday Smoker -- 1.0 packs/day  . Smokeless tobacco: Never Used   Comment: quit date of Christmas cruise (using e-cigarette)  . Alcohol Use: 3.0 oz/week    5 Glasses of wine per week     1-2 glasses of wine nightly.  . Drug Use: No  . Sexually Active: Yes    Birth Control/ Protection: Other-see comments     husband with vasectomy   Other Topics Concern  . None   Social History Narrative   Wadie Lessen daughter     REVIEW OF SYSTEMS -  PERTINENT POSITIVES ONLY: No significant bleeding. Moderate pain. Significant soft tissue swelling.  EXAM: Filed Vitals:   08/01/11 1636  BP: 121/79  Pulse: 81  Temp: 98.4 F (36.9 C)    HEENT: normocephalic; pupils equal and reactive; sclerae clear; dentition good; mucous membranes moist NECK:  symmetric on extension; no palpable anterior or posterior cervical lymphadenopathy; no supraclavicular masses; no tenderness CHEST: clear to auscultation bilaterally without rales, rhonchi, or wheezes CARDIAC: regular rate and rhythm without significant murmur; peripheral pulses are full GU:  Very large thrombosed external hemorrhoid in the left lateral column. There is superficial ulceration. There are multiple areas of thrombosis. This is exquisitely tender to palpation. EXT:  non-tender without edema; no deformity NEURO: no gross focal deficits; no sign of tremor   PROCEDURE: Under aseptic conditions the tissue  is infiltrated with local anesthetic with epinephrine. A 2.0 cm incision is made across the roof of the thrombosed hemorrhoidal column. Multiple areas of thrombosis or evacuated as well as ectatic external hemorrhoidal vein. Procedure is well tolerated. Dry gauze dressing is placed.   LABORATORY RESULTS: See [facility] ([facility]L-Epic) for most recent results   RADIOLOGY RESULTS: See [facility] (CHL-Epic) for most recent results   IMPRESSION: Thrombosed external hemorrhoidal vein, extensive  PLAN: Usual instructions for wound care are given both verbally and in writing. Patient will return for followup as needed.  The patient did have questions about possible surgical treatment of internal hemorrhoids in an attempt to prevent recurrence. I have provided her with written literature on the [facility] procedure. If she is interested, she should return to see my partner, Dr. Chevis Pretty.  The patient will return to see [place] as needed.  Velora Heckler, MD, FACS General & Endocrine Surgery [facility]   Visit Diagnoses: 1. Hemorrhoids, external, thrombosed     Primary Care Physician: Lavonda Jumbo, MD, MD

## 2011-08-01 NOTE — Progress Notes (Signed)
Subjective: Here for c/o worsening hemorrhoid.   Here with her husband today.  Was seen at the ED Saturday early morning for same.  Was advised at that time that it was not thrombosed.  Was given 1 Anusol suppository and advised to begin Miralax and f/u with general surgery.  She was not referred though.  She notes that since then the area is much bigger and very painful.  The area is now purple in some spots.  She has been using warm soaks.  In general, she eats very healthy, eats lots of fiber, drinks mostly water.  She is surprised that this hemorrhoid came out of the blue.  No recent straining, lifting, constipation.   She notes hx/o intermittent small hemorrhoids prior, has used both preparation H and Anusol cream prior.  Has never had anything like this though.   Past Medical History  Diagnosis Date  . Allergy   . Anxiety   . Rosacea     Dr. Emily Filbert   Gen: WD, WN, nad Rectal: large hemorrhoid tissue approx 6cm diameter, and 2 areas thrombosed, quite tender.  Assessment: Encounter Diagnosis  Name Primary?  . Hemorrhoids thrombosed Yes   Plan: Given the size and appearance of the hemorrhoid, referred to general surgery today at 4:30pm with [facility].  In the meantime, advised Colace for stool softener, gave Percocet for pain, epsom salt soaks/SITZ baths, and f/u with surgery this afternoon.

## 2011-08-01 NOTE — Patient Instructions (Signed)
ANORECTAL PROCEDURES: 1.  Tub soaks 2-3 times daily in warm water (may add Epsom salts if desired) 2.  Stool softener for one month (store brand Miralax or Colace) 3.  Avoid toilet paper - use baby wipes or Tucks pads 4.  Increase water intake - 6-8 glasses daily 5.  Apply dry pad to area until drainage stops 

## 2011-08-01 NOTE — Progress Notes (Signed)
Addended by: Jac Canavan on: 08/01/2011 12:58 PM   Modules accepted: Orders

## 2011-12-06 ENCOUNTER — Telehealth: Payer: Self-pay | Admitting: Internal Medicine

## 2011-12-06 NOTE — Telephone Encounter (Signed)
She needs OV.  At her last OV in March, she stated she wanted to do injections rather than pills, so she was supposed to get monthly injections x 3 and have labs done in June.  Patient never returned.  Lab orders are in system (since March, due in June) for CBC and B12 level.  She should have these labs done prior to appointment.  We can give B12 and flu shot at her visit

## 2011-12-06 NOTE — Telephone Encounter (Signed)
Pt coming in

## 2011-12-06 NOTE — Telephone Encounter (Signed)
Pt states that she would like to go back on the b12 shot instead of the pill. Pt states that she has not taken the b12 pills in the last couple of months. Pt wants to know if she can come in to get her b12 checked and also wants a flu shot when she comes in, or does she need to schedule an appt with you

## 2011-12-13 ENCOUNTER — Other Ambulatory Visit: Payer: BC Managed Care – PPO

## 2011-12-13 DIAGNOSIS — D510 Vitamin B12 deficiency anemia due to intrinsic factor deficiency: Secondary | ICD-10-CM

## 2011-12-13 DIAGNOSIS — D7589 Other specified diseases of blood and blood-forming organs: Secondary | ICD-10-CM

## 2011-12-13 LAB — CBC WITH DIFFERENTIAL/PLATELET
Basophils Absolute: 0 10*3/uL (ref 0.0–0.1)
Eosinophils Absolute: 0.3 10*3/uL (ref 0.0–0.7)
Eosinophils Relative: 5 % (ref 0–5)
MCH: 35.6 pg — ABNORMAL HIGH (ref 26.0–34.0)
MCV: 101.2 fL — ABNORMAL HIGH (ref 78.0–100.0)
Neutrophils Relative %: 64 % (ref 43–77)
Platelets: 315 10*3/uL (ref 150–400)
RDW: 11.9 % (ref 11.5–15.5)
WBC: 5 10*3/uL (ref 4.0–10.5)

## 2011-12-14 LAB — VITAMIN B12: Vitamin B-12: 388 pg/mL (ref 211–911)

## 2011-12-15 ENCOUNTER — Ambulatory Visit (INDEPENDENT_AMBULATORY_CARE_PROVIDER_SITE_OTHER): Payer: BC Managed Care – PPO | Admitting: Family Medicine

## 2011-12-15 ENCOUNTER — Encounter: Payer: Self-pay | Admitting: Family Medicine

## 2011-12-15 VITALS — BP 102/62 | HR 76 | Ht 66.0 in | Wt 122.0 lb

## 2011-12-15 DIAGNOSIS — F17200 Nicotine dependence, unspecified, uncomplicated: Secondary | ICD-10-CM

## 2011-12-15 DIAGNOSIS — Z23 Encounter for immunization: Secondary | ICD-10-CM

## 2011-12-15 DIAGNOSIS — K644 Residual hemorrhoidal skin tags: Secondary | ICD-10-CM | POA: Insufficient documentation

## 2011-12-15 DIAGNOSIS — D510 Vitamin B12 deficiency anemia due to intrinsic factor deficiency: Secondary | ICD-10-CM

## 2011-12-15 MED ORDER — HYDROCORTISONE 2.5 % RE CREA: TOPICAL_CREAM | RECTAL | Status: AC

## 2011-12-15 MED ORDER — CYANOCOBALAMIN 1000 MCG/ML IJ SOLN
1000.0000 ug | Freq: Once | INTRAMUSCULAR | Status: AC
  Administered 2011-12-15: 1000 ug via INTRAMUSCULAR

## 2011-12-15 NOTE — Patient Instructions (Signed)
Come monthly for B12 shots. Follow up in 6 months with labs prior.  Consider trying Estroven (black cohosh, soy) for menopausal symptoms.  Please keep working on quitting smoking. 1-800-QUITNOW or [place]Quit[place] can be helpful for free advice

## 2011-12-15 NOTE — Progress Notes (Signed)
Chief Complaint  Patient presents with  . Advice Only    discuss labs that were done 12/13/11.    Patient presents for B12 shot, and to follow up on her recent labs.  She got busy over the summer, and didn't return for monthly injections ,as we had previously had discussed.  She would like to resume monthly injections, rather than having to take medication daily.  Trying to quit smoking since she turned 51.  Using e-cigarette, but still smoking some.  She updates me that she had TSH checked recently at GYN was 2.71, FSH 81 Having menopausal symptoms, and HRT was suggested.  She is hesitant to take hormones, given mother's h/o breast cancer  Needs refill on anusol HC.  hemorroids only flare up occasionally, mild.  Past Medical History  Diagnosis Date  . Allergy   . Anxiety   . Rosacea     Dr. Emily Filbert   Past Surgical History  Procedure Date  . Rectal fissure    History   Social History  . Marital Status: Married    Spouse Name: N/A    Number of Children: 2  . Years of Education: N/A   Occupational History  . Yoga instructor    Social History Main Topics  . Smoking status: Current Every Day Smoker -- 1.0 packs/day    Types: Cigarettes  . Smokeless tobacco: Never Used   Comment: quit date of Christmas cruise (using e-cigarette)  . Alcohol Use: 3.0 oz/week    5 Glasses of wine per week     1-2 glasses of wine nightly.  . Drug Use: No  . Sexually Active: Yes    Birth Control/ Protection: Other-see comments     husband with vasectomy   Other Topics Concern  . Not on file   Social History Narrative   Wadie Lessen daughter   Family History  Problem Relation Age of Onset  . Breast cancer Mother   . Graves' disease Mother   . Hyperlipidemia Mother   . Cancer Mother     breast  . Hypertension Father   . Anxiety disorder Father   . Thyroid disease Brother   . Colon cancer Maternal Grandmother   . Cancer Maternal Grandmother     colon  . Aortic aneurysm Maternal  Grandfather   . Lung cancer Maternal Grandfather   . Diabetes Neg Hx    Current outpatient prescriptions:cetirizine (ZYRTEC) 10 MG tablet, Take 10 mg by mouth daily.  , Disp: , Rfl: ;  Multiple Vitamin (MULITIVITAMIN WITH MINERALS) TABS, Take 1 tablet by mouth daily., Disp: , Rfl: ;  FINACEA 15 % cream, Apply 1 application topically daily as needed. For rosacea flare ups, Disp: , Rfl: ;  fish oil-omega-3 fatty acids 1000 MG capsule, Take 1 g by mouth daily., Disp: , Rfl:  hydrocortisone (ANUSOL-HC) 2.5 % rectal cream, Apply rectally 2 times daily, Disp: 30 g, Rfl: 1;  ibuprofen (ADVIL,MOTRIN) 200 MG tablet, Take 200-600 mg by mouth every 6 (six) hours as needed. For headache or pain, Disp: , Rfl:  Only taking MVI, zyrtec (prn), and advil prn.  No Known Allergies  ROS:  Denies fevers, fatigue, headaches, URI symptoms, cough, shortness of breath, chest pain, rash, edema, or other concerns.  PHYSICAL EXAM: BP 102/62  Pulse 76  Ht 5\' 6"  (1.676 m)  Wt 122 lb (55.339 kg)  BMI 19.69 kg/m2  LMP 10/10/2011 Well developed, pleasant female in no distress. Exam was limited to discussion/counseling   Lab Results  Component Value Date   WBC 5.0 12/13/2011   HGB 11.5* 12/13/2011   HCT 32.7* 12/13/2011   MCV 101.2* 12/13/2011   PLT 315 12/13/2011   Vitamin B12 388  ASSESSMENT/PLAN:  1. Pernicious anemia  cyanocobalamin ((VITAMIN B-12)) injection 1,000 mcg  2. Tobacco use disorder    3. Need for prophylactic vaccination and inoculation against influenza  Flu vaccine greater than or equal to 3yo preservative free IM  4. External hemorrhoid  hydrocortisone (ANUSOL-HC) 2.5 % rectal cream   Monthly B12 shots x 6 months.  F/u with labs prior in 6 months.  Discussed returning here monthly, vs teaching her/family how to administer injections if she would prefer to give herself if more convenient.  She prefers to come here monthly.  Reassured that B12 levels were at lower limit normal, and that Hg and  MCV are actually improved compared to last check  Menopausal symptoms--discussed OTC meds that might be helpful, and discussed risks/benefits (briefly) of HRT, and that these may be warranted if symptoms are more severe and affect her quality of life. At this point, they are not severe enough to warrant HRT, but she is willing to try herbal/OTC measures.  Counseled re: smoking cessation.  Encouraged counseling, and reviewed some techniques to break the habit.    25 minute visit, all spent counseling

## 2012-01-16 ENCOUNTER — Other Ambulatory Visit (INDEPENDENT_AMBULATORY_CARE_PROVIDER_SITE_OTHER): Payer: BC Managed Care – PPO

## 2012-01-16 DIAGNOSIS — E538 Deficiency of other specified B group vitamins: Secondary | ICD-10-CM

## 2012-01-16 MED ORDER — CYANOCOBALAMIN 1000 MCG/ML IJ SOLN
1000.0000 ug | Freq: Once | INTRAMUSCULAR | Status: AC
  Administered 2012-01-16: 1000 ug via INTRAMUSCULAR

## 2012-02-15 ENCOUNTER — Other Ambulatory Visit (INDEPENDENT_AMBULATORY_CARE_PROVIDER_SITE_OTHER): Payer: BC Managed Care – PPO

## 2012-02-15 DIAGNOSIS — D649 Anemia, unspecified: Secondary | ICD-10-CM

## 2012-02-15 MED ORDER — CYANOCOBALAMIN 1000 MCG/ML IJ SOLN
1000.0000 ug | Freq: Once | INTRAMUSCULAR | Status: AC
  Administered 2012-02-15: 1000 ug via INTRAMUSCULAR

## 2012-03-14 ENCOUNTER — Other Ambulatory Visit (INDEPENDENT_AMBULATORY_CARE_PROVIDER_SITE_OTHER): Payer: BC Managed Care – PPO

## 2012-03-14 DIAGNOSIS — D510 Vitamin B12 deficiency anemia due to intrinsic factor deficiency: Secondary | ICD-10-CM

## 2012-03-14 MED ORDER — CYANOCOBALAMIN 1000 MCG/ML IJ SOLN
1000.0000 ug | Freq: Once | INTRAMUSCULAR | Status: AC
  Administered 2012-03-14: 1000 ug via INTRAMUSCULAR

## 2012-04-11 ENCOUNTER — Other Ambulatory Visit (INDEPENDENT_AMBULATORY_CARE_PROVIDER_SITE_OTHER): Payer: BC Managed Care – PPO

## 2012-04-11 DIAGNOSIS — E538 Deficiency of other specified B group vitamins: Secondary | ICD-10-CM

## 2012-04-11 MED ORDER — CYANOCOBALAMIN 1000 MCG/ML IJ SOLN
1000.0000 ug | Freq: Once | INTRAMUSCULAR | Status: AC
  Administered 2012-04-11: 1000 ug via INTRAMUSCULAR

## 2012-05-28 ENCOUNTER — Telehealth: Payer: Self-pay | Admitting: Family Medicine

## 2012-05-28 DIAGNOSIS — D519 Vitamin B12 deficiency anemia, unspecified: Secondary | ICD-10-CM

## 2012-05-28 NOTE — Telephone Encounter (Signed)
Orders entered for B12 and CBC.  Will address her other concerns at med check next week (and either draw more labs, or add on--I have no idea what her symptoms are to know what to draw)

## 2012-05-28 NOTE — Telephone Encounter (Signed)
This was sent to me.

## 2012-05-28 NOTE — Telephone Encounter (Signed)
Called patient and gave her your recommendation. Looks like she does have an appointment for labwork scheduled for tomorrow morning, there are no future orders though so I will need to know what those are please so I can order them. And she has a med check appointment scheduled for next Monday 06/04/12/ with you. Thanks.

## 2012-05-28 NOTE — Telephone Encounter (Signed)
I don't do "hormone detail" testing. If she is having a "terrible time" she should schedule an OV.  I don't see ANY future orders in computer, nor a f/u appt with me

## 2012-05-28 NOTE — Telephone Encounter (Signed)
Pt called and states coming in the morning for labs, she wanted to make sure you are checking her cholesterol and she wants to add hormone detail testing.  She is having a terrible time and she said Dr. Lynelle Doctor said she would add on anything she wanted added.  Please call pt at [phone]

## 2012-05-29 ENCOUNTER — Other Ambulatory Visit: Payer: Self-pay | Admitting: Family Medicine

## 2012-05-29 ENCOUNTER — Other Ambulatory Visit: Payer: BC Managed Care – PPO

## 2012-05-29 DIAGNOSIS — D519 Vitamin B12 deficiency anemia, unspecified: Secondary | ICD-10-CM

## 2012-05-29 LAB — CBC WITH DIFFERENTIAL/PLATELET
Basophils Relative: 0 % (ref 0–1)
Eosinophils Absolute: 0.2 10*3/uL (ref 0.0–0.7)
Eosinophils Relative: 3 % (ref 0–5)
HCT: 36.7 % (ref 36.0–46.0)
Hemoglobin: 12.6 g/dL (ref 12.0–15.0)
Lymphs Abs: 0.9 10*3/uL (ref 0.7–4.0)
MCH: 33.6 pg (ref 26.0–34.0)
MCHC: 34.3 g/dL (ref 30.0–36.0)
MCV: 97.9 fL (ref 78.0–100.0)
Monocytes Absolute: 0.5 10*3/uL (ref 0.1–1.0)
Monocytes Relative: 6 % (ref 3–12)
RBC: 3.75 MIL/uL — ABNORMAL LOW (ref 3.87–5.11)

## 2012-05-29 LAB — VITAMIN B12: Vitamin B-12: 386 pg/mL (ref 211–911)

## 2012-05-30 LAB — LIPID PANEL
HDL: 45 mg/dL (ref >39)
LDL Cholesterol: 114 mg/dL — ABNORMAL HIGH (ref 0–99)
Total CHOL/HDL Ratio: 4 Ratio
VLDL: 19 mg/dL (ref 0–40)

## 2012-05-30 LAB — TSH: TSH: 2.18 u[IU]/mL (ref 0.350–4.500)

## 2012-06-04 ENCOUNTER — Encounter: Payer: Self-pay | Admitting: Family Medicine

## 2012-06-04 ENCOUNTER — Ambulatory Visit (INDEPENDENT_AMBULATORY_CARE_PROVIDER_SITE_OTHER): Payer: BC Managed Care – PPO | Admitting: Family Medicine

## 2012-06-04 VITALS — BP 100/60 | HR 72 | Ht 66.0 in | Wt 124.0 lb

## 2012-06-04 DIAGNOSIS — G4700 Insomnia, unspecified: Secondary | ICD-10-CM

## 2012-06-04 DIAGNOSIS — N951 Menopausal and female climacteric states: Secondary | ICD-10-CM

## 2012-06-04 DIAGNOSIS — Z1211 Encounter for screening for malignant neoplasm of colon: Secondary | ICD-10-CM

## 2012-06-04 DIAGNOSIS — D510 Vitamin B12 deficiency anemia due to intrinsic factor deficiency: Secondary | ICD-10-CM

## 2012-06-04 DIAGNOSIS — F17200 Nicotine dependence, unspecified, uncomplicated: Secondary | ICD-10-CM

## 2012-06-04 DIAGNOSIS — F411 Generalized anxiety disorder: Secondary | ICD-10-CM

## 2012-06-04 MED ORDER — CYANOCOBALAMIN 1000 MCG/ML IJ SOLN
1000.0000 ug | Freq: Once | INTRAMUSCULAR | Status: AC
  Administered 2012-06-04: 1000 ug via INTRAMUSCULAR

## 2012-06-04 MED ORDER — ALPRAZOLAM 0.5 MG PO TABS: 0.2500 mg | ORAL_TABLET | Freq: Three times a day (TID) | ORAL | Status: DC | PRN

## 2012-06-04 NOTE — Progress Notes (Signed)
Chief Complaint  Patient presents with  . Anemia    nonfasting med check.     She is complaining of feeling fatigued, having night sweats every night.  She isn't sleeping well--falls asleep fine, but wakes up.  Has trouble falling back to sleep, and that's when hot flashes start, and has trouble getting back to sleep.  She is feeling anxious, and somewhat depressed, "blah".  Feeling like this since Christmas, and may be related to the weather. Moods haven't improved since time change, longer days.  Advil PM sometimes helps with her sleep.  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  LMP was August, this is the longest stretch she has had without a period.  She sees Dr. Billy Coast for her GYN care, and has had hormone levels checked (no results available). Hasn't used herbal meds consistently (for treatment of hot flashes).  H/o anxiety in the past, but never treated with medication. Recalls doing better with more regular exercise, yoga, in past. Took one of her husband's pills once (think it was xanax, which she also took previously for flying).  Wondering if that could be helpful  Smoking up to 1/2 PPD since the holidays; previously had cut down more.  Past Medical History  Diagnosis Date  . Allergy   . Anxiety   . Rosacea     Dr. Emily Filbert   Past Surgical History  Procedure Laterality Date  . Rectal fissure     History   Social History  . Marital Status: Married    Spouse Name: N/A    Number of Children: 2  . Years of Education: N/A   Occupational History  . Yoga instructor    Social History Main Topics  . Smoking status: Current Every Day Smoker -- 0.50 packs/day    Types: Cigarettes  . Smokeless tobacco: Never Used     Comment: quit date of Christmas cruise (using e-cigarette)  . Alcohol Use: 3.0 oz/week    5 Glasses of wine per week     Comment: 3-4 glasses of wine per week.  . Drug Use: No  . Sexually Active: Yes    Birth Control/  Protection: Other-see comments     Comment: husband with vasectomy   Other Topics Concern  . Not on file   Social History Narrative   Wadie Lessen daughter   Current Outpatient Prescriptions on File Prior to Visit  Medication Sig Dispense Refill  . FINACEA 15 % cream Apply 1 application topically daily as needed. For rosacea flare ups      . Multiple Vitamin (MULITIVITAMIN WITH MINERALS) TABS Take 1 tablet by mouth daily.      . cetirizine (ZYRTEC) 10 MG tablet Take 10 mg by mouth daily.        . hydrocortisone (ANUSOL-HC) 2.5 % rectal cream Apply rectally 2 times daily  30 g  1  . ibuprofen (ADVIL,MOTRIN) 200 MG tablet Take 200-600 mg by mouth every 6 (six) hours as needed. For headache or pain       No current facility-administered medications on file prior to visit.   No Known Allergies  ROS:  No weight changes, fevers, URI symptoms, cough, shortness of breath, chest pain.  Denies GI complaints, GU complaits.  Slight tingling in R 4-5th fingers over the last week (short-lived).  Some headaches in the evenings, before bedtime Hemorrhoids persist, although much better since she had surgery.  Last colonoscopy 10 years ago.  See HPI.  +  depression, anxiety  PHYSICAL EXAM: BP 100/60  Pulse 72  Ht 5\' 6"  (1.676 m)  Wt 124 lb (56.246 kg)  BMI 20.02 kg/m2  LMP 10/05/2011 Well developed, pleasant female in no distress Neck: no lymphadenopathy, thyromegaly or mass Heart: regular rate and rhythm without  Murmur Back: no spine or CVA tenderness Abdomen: soft, nontender Extremities: no edema.  nontender at R elbow (ulnar nerve) Skin: no rash/lesions Psych: normal mood, affect, hygiene and grooming Neuro: normal strength, sensation gait, DTR's, cranial nerves. Alert and oriented.  Lab Results  Component Value Date   WBC 8.2 05/29/2012   HGB 12.6 05/29/2012   HCT 36.7 05/29/2012   MCV 97.9 05/29/2012   PLT 305 05/29/2012   Lab Results  Component Value Date   VITAMINB12 386 05/29/2012    Lab Results  Component Value Date   TSH 2.180 05/29/2012   Lab Results  Component Value Date   CHOL 178 05/29/2012   HDL 45 05/29/2012   LDLCALC 161* 05/29/2012   TRIG 97 05/29/2012   CHOLHDL 4.0 05/29/2012   Vitamin D-OH 33  ASSESSMENT/PLAN: Pernicious anemia - Plan: cyanocobalamin ((VITAMIN B-12)) injection 1,000 mcg  Tobacco use disorder  Screening for colon cancer - Plan: Ambulatory referral to Gastroenterology  Menopausal symptoms  Anxiety state, unspecified - Plan: ALPRAZolam (XANAX) 0.5 MG tablet  Insomnia   Menopausal symptoms--try herbal supplement such as Estroven on a daily basis.  Try to get regular exercise (at least 2-3 hours before bedtime), and regular yoga or other relaxation techniques.    Consider counseling--given [employer] number  Xanax--to use as needed. Counseled regarding risks/side effects 0.5mg , just just 1/2 during day (call if prior rx was lower) #20 Consider preventative measures, consider effexor due to hot flashes (vs other SSRI, SNRI)  Refer for colonoscopy (due to age)  B12 deficiency--macrocytosis has resolved, no longer anemic.  B12 levels are now normal, requires ongoing monthly injections.

## 2012-06-04 NOTE — Patient Instructions (Addendum)
Use the alprazolam sparingly, and do not drive after taking or mix with alcohol. Try calling [facility] Done for counseling for anxiety  Work on quitting smoking Please try and quit smoking--start thinking about why/when you smoke (habit, boredom, stress) in order to come up with effective strategies to cut back or quit. Available resources to help you quit include free counseling through [facility] Quitline ([place]Quitline.com or 1-800-QUITNOW), smoking cessation classes through [facility] (call to find out schedule), over-the-counter nicotine replacements, and e-cigarettes (although this may not help break the hand-mouth habit).  Many insurance companies also have smoking cessation programs (which may decrease the cost of patches, meds if enrolled).  If these methods are not effective for you, and you are motivated to quit, return to discuss the possibility of prescription medications.  Work on stress reduction techniques, increased exercise during the day. Okay to use diphenhydramine to help you sleep (the PM portion--you probably don't need the Advil).  Try taking Estroven daily as directed on package.  Follow up with Dr. Billy Coast to discuss other options if you continue to have significant menopausal symptoms (hot flashes, night sweats, insomnia, anxiety, etc).

## 2012-06-18 ENCOUNTER — Encounter: Payer: Self-pay | Admitting: Family Medicine

## 2012-06-18 ENCOUNTER — Ambulatory Visit (INDEPENDENT_AMBULATORY_CARE_PROVIDER_SITE_OTHER): Payer: BC Managed Care – PPO | Admitting: Family Medicine

## 2012-06-18 VITALS — BP 104/70 | HR 68 | Temp 98.2°F | Ht 66.0 in | Wt 124.0 lb

## 2012-06-18 DIAGNOSIS — J019 Acute sinusitis, unspecified: Secondary | ICD-10-CM

## 2012-06-18 DIAGNOSIS — F17200 Nicotine dependence, unspecified, uncomplicated: Secondary | ICD-10-CM

## 2012-06-18 DIAGNOSIS — J069 Acute upper respiratory infection, unspecified: Secondary | ICD-10-CM

## 2012-06-18 MED ORDER — AZITHROMYCIN 250 MG PO TABS: ORAL_TABLET | ORAL | Status: DC

## 2012-06-18 NOTE — Progress Notes (Signed)
Chief Complaint  Patient presents with  . URI    runny nose, sinus pressure, HA in her eyeballs-feels like her eyes are going to pop out. Green mucus. X 10 days.   Her son had a cold 2 weeks ago.  10 days ago she started with sneezing, congestion, slight cough.  Never had fever.  Started getting worse last week, while at the beach.  Increasing head congestion.  Used some Advil decongestant, which didn't help. Took a benadryl last night, and during the night last night got up a lot of green mucus.  Mucus is thick, having headache between her eyes, and having a lot of pressure. She has gotten worse in the last 2 days.  Past Medical History  Diagnosis Date  . Allergy   . Anxiety   . Rosacea     Dr. Emily Filbert   Past Surgical History  Procedure Laterality Date  . Rectal fissure     History   Social History  . Marital Status: Married    Spouse Name: N/A    Number of Children: 2  . Years of Education: N/A   Occupational History  . Yoga instructor    Social History Main Topics  . Smoking status: Current Every Day Smoker -- 0.50 packs/day    Types: Cigarettes  . Smokeless tobacco: Never Used     Comment: quit date of Christmas cruise (using e-cigarette)  . Alcohol Use: 3.0 oz/week    5 Glasses of wine per week     Comment: 3-4 glasses of wine per week.  . Drug Use: No  . Sexually Active: Yes    Birth Control/ Protection: Other-see comments     Comment: husband with vasectomy   Other Topics Concern  . Not on file   Social History Narrative   Wadie Lessen daughter   Current Outpatient Prescriptions on File Prior to Visit  Medication Sig Dispense Refill  . ibuprofen (ADVIL,MOTRIN) 200 MG tablet Take 200-600 mg by mouth every 6 (six) hours as needed. For headache or pain      . Multiple Vitamin (MULITIVITAMIN WITH MINERALS) TABS Take 1 tablet by mouth daily.      . vitamin C (ASCORBIC ACID) 500 MG tablet Take 500 mg by mouth daily.      Marland Kitchen ALPRAZolam (XANAX) 0.5 MG tablet Take  0.5-1 tablets (0.25-0.5 mg total) by mouth 3 (three) times daily as needed for sleep or anxiety.  20 tablet  0  . cetirizine (ZYRTEC) 10 MG tablet Take 10 mg by mouth daily.        Marland Kitchen FINACEA 15 % cream Apply 1 application topically daily as needed. For rosacea flare ups      . hydrocortisone (ANUSOL-HC) 2.5 % rectal cream Apply rectally 2 times daily  30 g  1   No current facility-administered medications on file prior to visit.   No Known Allergies  ROS:  Denies fevers, sore throat, shortness of breath, nausea, vomiting, bowel changes, joint pains, chest pains, dysuria or other concerns except as noted in HPI  PHYSICAL EXAM: BP 104/70  Pulse 68  Temp(Src) 98.2 F (36.8 C) (Oral)  Ht 5\' 6"  (1.676 m)  Wt 124 lb (56.246 kg)  BMI 20.02 kg/m2  LMP 10/05/2011  Well developed pleasant female in no distress. Not coughing HEENT:  PERRL, EOMI, conjunctiva clear.  TM's and EAC's normal. Nasal mucosa moderately edematous, no erythema or purulence.  Mild tenderness of maxillary sinuses bilaterally.  OP clear Neck: no lymphadenopathy or  mass Heart: regular rate and rhythm without murmur Lungs: clear bilaterally Skin: no rash   ASSESSMENT/PLAN:  Acute upper respiratory infections of unspecified site  Sinusitis, acute - Plan: azithromycin (ZITHROMAX) 250 MG tablet  Tobacco use disorder  URI, possibly now turning into early bacterial sinus infection.  Patient smokes.  Encouraged decongestant, sinus rinse and guaifenesin for a day, and if not improving, then start ABX (and complete).  Use decongestant (oral or nasal) prior to plane flight. Encouraged to quit smoking.  F/u if symptoms persist or worsen.

## 2012-06-18 NOTE — Patient Instructions (Addendum)
Use decongestant, sinus rinse and guaifenesin (ie Mucinex), and start the antibiotic.  Use decongestant (oral or nasal) prior to plane flight. (nasal is Afrin--use this if you aren't tolerating oral sinus medication).

## 2012-06-28 ENCOUNTER — Other Ambulatory Visit: Payer: Self-pay

## 2012-06-28 DIAGNOSIS — Z1231 Encounter for screening mammogram for malignant neoplasm of breast: Secondary | ICD-10-CM

## 2012-06-28 DIAGNOSIS — K635 Polyp of colon: Secondary | ICD-10-CM

## 2012-06-28 HISTORY — PX: COLONOSCOPY: SHX174

## 2012-06-28 HISTORY — DX: Polyp of colon: K63.5

## 2012-07-03 ENCOUNTER — Encounter: Payer: Self-pay | Admitting: Gastroenterology

## 2012-07-03 ENCOUNTER — Ambulatory Visit (AMBULATORY_SURGERY_CENTER): Payer: BC Managed Care – PPO | Admitting: *Deleted

## 2012-07-03 VITALS — Ht 67.0 in | Wt 122.6 lb

## 2012-07-03 DIAGNOSIS — Z1211 Encounter for screening for malignant neoplasm of colon: Secondary | ICD-10-CM

## 2012-07-03 MED ORDER — NA SULFATE-K SULFATE-MG SULF 17.5-3.13-1.6 GM/177ML PO SOLN: ORAL | Status: DC

## 2012-07-04 ENCOUNTER — Other Ambulatory Visit (INDEPENDENT_AMBULATORY_CARE_PROVIDER_SITE_OTHER): Payer: BC Managed Care – PPO

## 2012-07-04 DIAGNOSIS — D510 Vitamin B12 deficiency anemia due to intrinsic factor deficiency: Secondary | ICD-10-CM

## 2012-07-04 MED ORDER — CYANOCOBALAMIN 1000 MCG/ML IJ SOLN
1000.0000 ug | Freq: Once | INTRAMUSCULAR | Status: AC
  Administered 2012-07-04: 1000 ug via INTRAMUSCULAR

## 2012-07-17 ENCOUNTER — Encounter: Payer: Self-pay | Admitting: Gastroenterology

## 2012-07-17 ENCOUNTER — Ambulatory Visit (AMBULATORY_SURGERY_CENTER): Payer: BC Managed Care – PPO | Admitting: Gastroenterology

## 2012-07-17 VITALS — BP 96/67 | HR 60 | Temp 96.8°F | Resp 17 | Ht 67.0 in | Wt 122.0 lb

## 2012-07-17 DIAGNOSIS — Z1211 Encounter for screening for malignant neoplasm of colon: Secondary | ICD-10-CM

## 2012-07-17 DIAGNOSIS — D126 Benign neoplasm of colon, unspecified: Secondary | ICD-10-CM

## 2012-07-17 MED ORDER — SODIUM CHLORIDE 0.9 % IV SOLN: 500.0000 mL | INTRAVENOUS | Status: DC

## 2012-07-17 NOTE — Patient Instructions (Addendum)

## 2012-07-17 NOTE — Op Note (Signed)
[facility] [address] [place], [address]   COLONOSCOPY PROCEDURE REPORT  PATIENT: Lindsay Chang, Lindsay Chang  MR#: [id number] BIRTHDATE: 1961/01/02 , 51  yrs. old GENDER: Female ENDOSCOPIST: Meryl Dare, MD, [facility] REFERRED Edison Simon, M.D. PROCEDURE DATE:  07/17/2012 PROCEDURE:   Colonoscopy with snare polypectomy ASA CLASS:   Class II INDICATIONS: average risk screening.  MGM with colon cancer. MEDICATIONS: MAC sedation, administered by CRNA and propofol (Diprivan) 300mg  IV DESCRIPTION OF PROCEDURE:   After the risks benefits and alternatives of the procedure were thoroughly explained, informed consent was obtained.  A digital rectal exam revealed moderate external hemorrhoids.   The LB [id number]  endoscope was introduced through the anus and advanced to the cecum, which was identified by both the appendix and ileocecal valve. No adverse events experienced with a tortuous colon.   The quality of the prep was good, using MoviPrep  The instrument was then slowly withdrawn as the colon was fully examined.  COLON FINDINGS: A sessile polyp measuring 5 mm in size was found in the sigmoid colon.  A polypectomy was performed with a cold snare. The resection was complete and the polyp tissue was completely retrieved. Mild divertiuclosis was noted in the sigmoid colon.  The colon was otherwise normal.  There was no diverticulosis, inflammation, polyps or cancers unless previously stated. Retroflexed views revealed moderate external hemorrhoids. The time to cecum=3 minutes 50 seconds.  Withdrawal time=12 minutes 04 seconds.  The scope was withdrawn and the procedure completed.  COMPLICATIONS: There were no complications.  ENDOSCOPIC IMPRESSION: 1.    Sessile polyp measuring 5 mm in the sigmoid colon; polypectomy performed with a cold snare 2.    Mild sigmoid colon diverticulosis 3.    Moderate external hemorrhoids  RECOMMENDATIONS: 1.  Await pathology results 2.   High fiber diet with liberal fluid intake 2.  Repeat colonoscopy in 5 years if polyp adenomatous; otherwise 10 years   eSigned:  Meryl Dare, MD, [facility] 07/17/2012 12:00 PM

## 2012-07-17 NOTE — Progress Notes (Signed)
Patient did not experience any of the following events: a burn prior to discharge; a fall within the facility; wrong site/side/patient/procedure/implant event; or a hospital transfer or hospital admission upon discharge from the facility. (G8907) Patient did not have preoperative order for IV antibiotic SSI prophylaxis. (G8918)  

## 2012-07-17 NOTE — Progress Notes (Signed)
Called to room to assist during endoscopic procedure.  Patient ID and intended procedure confirmed with present staff. Received instructions for my participation in the procedure from the performing physician.  

## 2012-07-18 ENCOUNTER — Telehealth: Payer: Self-pay | Admitting: *Deleted

## 2012-07-18 NOTE — Telephone Encounter (Signed)
  Follow up Call-  Call back number 07/17/2012  Post procedure Call Back phone  # [phone]  Permission to leave phone message Yes     Patient questions:  Do you have a fever, pain , or abdominal swelling? no Pain Score  0 *  Have you tolerated food without any problems? yes  Have you been able to return to your normal activities? yes  Do you have any questions about your discharge instructions: Diet   no Medications  no Follow up visit  no  Do you have questions or concerns about your Care? no  Actions: * If pain score is 4 or above: No action needed, pain <4.

## 2012-07-19 ENCOUNTER — Ambulatory Visit: Payer: BC Managed Care – PPO

## 2012-07-19 ENCOUNTER — Ambulatory Visit
Admission: RE | Admit: 2012-07-19 | Discharge: 2012-07-19 | Disposition: A | Discharge status: Home / Self Care | Payer: BC Managed Care – PPO | Source: Ambulatory Visit

## 2012-07-19 DIAGNOSIS — Z1231 Encounter for screening mammogram for malignant neoplasm of breast: Secondary | ICD-10-CM

## 2012-07-24 ENCOUNTER — Encounter: Payer: Self-pay | Admitting: Gastroenterology

## 2012-07-24 ENCOUNTER — Encounter: Payer: Self-pay | Admitting: Family Medicine

## 2012-08-06 ENCOUNTER — Other Ambulatory Visit (INDEPENDENT_AMBULATORY_CARE_PROVIDER_SITE_OTHER): Payer: BC Managed Care – PPO

## 2012-08-06 DIAGNOSIS — E538 Deficiency of other specified B group vitamins: Secondary | ICD-10-CM

## 2012-08-06 MED ORDER — VITAMIN B-12 100 MCG PO TABS
1000.0000 ug | ORAL_TABLET | Freq: Every day | ORAL | Status: DC
  Administered 2012-08-06: 1000 ug via ORAL

## 2012-09-27 ENCOUNTER — Other Ambulatory Visit: Payer: BC Managed Care – PPO

## 2012-10-01 ENCOUNTER — Other Ambulatory Visit (INDEPENDENT_AMBULATORY_CARE_PROVIDER_SITE_OTHER): Payer: BC Managed Care – PPO

## 2012-10-01 DIAGNOSIS — D649 Anemia, unspecified: Secondary | ICD-10-CM

## 2012-10-01 MED ORDER — CYANOCOBALAMIN 1000 MCG/ML IJ SOLN
1000.0000 ug | Freq: Once | INTRAMUSCULAR | Status: AC
  Administered 2012-10-01: 1000 ug via INTRAMUSCULAR

## 2012-11-02 ENCOUNTER — Other Ambulatory Visit (INDEPENDENT_AMBULATORY_CARE_PROVIDER_SITE_OTHER): Payer: BC Managed Care – PPO

## 2012-11-02 DIAGNOSIS — E538 Deficiency of other specified B group vitamins: Secondary | ICD-10-CM

## 2012-11-02 MED ORDER — VITAMIN B-12 100 MCG PO TABS
1000.0000 ug | ORAL_TABLET | Freq: Every day | ORAL | Status: DC
  Administered 2012-11-02: 1000 ug via ORAL

## 2012-11-21 ENCOUNTER — Other Ambulatory Visit: Payer: Self-pay | Admitting: Obstetrics and Gynecology

## 2012-11-21 DIAGNOSIS — Z780 Asymptomatic menopausal state: Secondary | ICD-10-CM

## 2012-12-03 ENCOUNTER — Encounter: Payer: BC Managed Care – PPO | Admitting: Family Medicine

## 2012-12-03 ENCOUNTER — Telehealth: Payer: Self-pay | Admitting: Family Medicine

## 2012-12-03 NOTE — Telephone Encounter (Signed)
Patient informed and verbalized understanding

## 2012-12-03 NOTE — Telephone Encounter (Signed)
I'm not sure I understand (canceled today's appt, but willing to come in today for shot?)--okay to wait until 10/20 appointment.   We just have to make sure that blood is drawn PRIOR to giving B12 shot.  Is she going to be fasting for her appt? Review of chart shows she hasn't had a screen for diabetes (fasting sugar) in over a year.  I was going to decide what labs to order at her visit, based on her symptoms (might do full chem panel, which also hasn't been done in a while, if having fatigue) rather than having her come prior for labs.  Therefore, I recommend she not eat for at least 6 hours prior to visit so we can check sugar.

## 2012-12-17 ENCOUNTER — Ambulatory Visit (INDEPENDENT_AMBULATORY_CARE_PROVIDER_SITE_OTHER): Payer: BC Managed Care – PPO | Admitting: Family Medicine

## 2012-12-17 ENCOUNTER — Encounter: Payer: Self-pay | Admitting: Family Medicine

## 2012-12-17 VITALS — BP 112/72 | HR 68 | Ht 66.0 in | Wt 123.0 lb

## 2012-12-17 DIAGNOSIS — F17200 Nicotine dependence, unspecified, uncomplicated: Secondary | ICD-10-CM

## 2012-12-17 DIAGNOSIS — Z79899 Other long term (current) drug therapy: Secondary | ICD-10-CM

## 2012-12-17 DIAGNOSIS — Z13220 Encounter for screening for lipoid disorders: Secondary | ICD-10-CM

## 2012-12-17 DIAGNOSIS — Z23 Encounter for immunization: Secondary | ICD-10-CM

## 2012-12-17 DIAGNOSIS — D510 Vitamin B12 deficiency anemia due to intrinsic factor deficiency: Secondary | ICD-10-CM

## 2012-12-17 DIAGNOSIS — R5381 Other malaise: Secondary | ICD-10-CM

## 2012-12-17 DIAGNOSIS — Z0000 Encounter for general adult medical examination without abnormal findings: Secondary | ICD-10-CM

## 2012-12-17 LAB — COMPREHENSIVE METABOLIC PANEL
ALT: <8 U/L (ref 0–35)
AST: 13 U/L (ref 0–37)
Albumin: 4.4 g/dL (ref 3.5–5.2)
BUN: 12 mg/dL (ref 6–23)
CO2: 26 mEq/L (ref 19–32)
Calcium: 9.7 mg/dL (ref 8.4–10.5)
Chloride: 103 mEq/L (ref 96–112)
Potassium: 4.3 mEq/L (ref 3.5–5.3)
Sodium: 136 mEq/L (ref 135–145)
Total Protein: 7.7 g/dL (ref 6.0–8.3)

## 2012-12-17 LAB — CBC WITH DIFFERENTIAL/PLATELET
Basophils Absolute: 0 10*3/uL (ref 0.0–0.1)
Basophils Relative: 1 % (ref 0–1)
Eosinophils Absolute: 0.2 10*3/uL (ref 0.0–0.7)
Eosinophils Relative: 3 % (ref 0–5)
Hemoglobin: 12.9 g/dL (ref 12.0–15.0)
MCH: 33.8 pg (ref 26.0–34.0)
MCHC: 35.4 g/dL (ref 30.0–36.0)
Neutro Abs: 3.9 10*3/uL (ref 1.7–7.7)
Neutrophils Relative %: 63 % (ref 43–77)
Platelets: 277 10*3/uL (ref 150–400)
RDW: 12.7 % (ref 11.5–15.5)
WBC: 6.1 10*3/uL (ref 4.0–10.5)

## 2012-12-17 LAB — LIPID PANEL
Cholesterol: 189 mg/dL (ref 0–200)
VLDL: 17 mg/dL (ref 0–40)

## 2012-12-17 LAB — VITAMIN B12: Vitamin B-12: 456 pg/mL (ref 211–911)

## 2012-12-17 MED ORDER — CYANOCOBALAMIN 1000 MCG/ML IJ SOLN
1000.0000 ug | Freq: Once | INTRAMUSCULAR | Status: AC
  Administered 2012-12-17: 1000 ug via INTRAMUSCULAR

## 2012-12-17 NOTE — Progress Notes (Signed)
Chief Complaint  Patient presents with  . med check    fasting med check and b12 shot and pt got flu shot today   Patient has no specific complaints or concerns today.    B12 deficiency with h/o anemia and macrocytosis.  Due for labs today (last was 6 months ago, at which time her macrocytosis and anemia had finally resolved).  Last B12 shot was 9/5.  Currently doing well without symptoms (energy is good; only occasional tingling in fingers.  Hasn't had tingling in her feet since the rash on her legs resolved with taking vitamin C).  Denies memory issues.  Smoking--has cut back, using vapor cigarette Quit drinking coffee, which was one of her triggers. Husband still smokes, smoking in car when kids aren't present is another trigger.  No further problems with spots/rashes on her leg since she started taking Vitamin C pills.  Past Medical History  Diagnosis Date  . Allergy   . Anxiety   . Rosacea     Dr. Emily Filbert  . Colon polyp, hyperplastic 06/2012   Past Surgical History  Procedure Laterality Date  . Rectal fissure  2002  . Colonoscopy  06/2012    Dr. Russella Dar   History   Social History  . Marital Status: Married    Spouse Name: N/A    Number of Children: 2  . Years of Education: N/A   Occupational History  . Yoga instructor    Social History Main Topics  . Smoking status: Current Every Day Smoker -- 0.50 packs/day    Types: Cigarettes  . Smokeless tobacco: Never Used     Comment: quit date of Christmas cruise (using e-cigarette)  . Alcohol Use: 3.0 oz/week    5 Glasses of wine per week     Comment: 3-4 glasses of wine per week.  . Drug Use: No  . Sexual Activity: Yes    Birth Control/ Protection: Other-see comments     Comment: husband with vasectomy   Other Topics Concern  . Not on file   Social History Narrative   Wadie Lessen daughter. 2 children (at [employer] and [employer]).  Husband also smokes   Current outpatient prescriptions:cetirizine (ZYRTEC) 10  MG tablet, Take 10 mg by mouth daily.  , Disp: , Rfl: ;  Famotidine (PEPCID PO), Take by mouth., Disp: , Rfl: ;  ibuprofen (ADVIL,MOTRIN) 200 MG tablet, Take 200-600 mg by mouth every 6 (six) hours as needed. For headache or pain, Disp: , Rfl: ;  Multiple Vitamin (MULITIVITAMIN WITH MINERALS) TABS, Take 1 tablet by mouth daily., Disp: , Rfl:  vitamin C (ASCORBIC ACID) 500 MG tablet, Take 500 mg by mouth daily., Disp: , Rfl: ;  ALPRAZolam (XANAX) 0.5 MG tablet, Take 0.5-1 tablets (0.25-0.5 mg total) by mouth 3 (three) times daily as needed for sleep or anxiety., Disp: 20 tablet, Rfl: 0;  FINACEA 15 % cream, Apply 1 application topically daily as needed. For rosacea flare ups, Disp: , Rfl:  Current facility-administered medications:vitamin B-12 (CYANOCOBALAMIN) tablet 1,000 mcg, 1,000 mcg, Oral, Daily, Joselyn Arrow, MD, 1,000 mcg at 08/06/12 1118;  vitamin B-12 (CYANOCOBALAMIN) tablet 1,000 mcg, 1,000 mcg, Oral, Daily, Kermit Balo Tysinger, PA-C, 1,000 mcg at 11/02/12 1110  No Known Allergies  ROS:  Denies headaches, dizziness, fevers, URI symptoms, cough, shortness of breath, chest pain, nausea, vomiting, bowel changes, dysphagia, abdominal pain.  Denies bleeding/bruising, rashes, depression, numbness, tingling, weakness or other concerns except as per HPI.  PHYSICAL EXAM: BP 112/72  Pulse  68  Ht 5\' 6"  (1.676 m)  Wt 123 lb (55.792 kg)  BMI 19.86 kg/m2 Well developed, pleasant, thin female in no distress HEENT:  PERRL, EOMI, conjunctiva clear. Neck: no lymphadenopathy, thyromegaly or mass Heart: regular rate and rhythm without murmur Lungs: clear bilaterally Abdomen: soft, nontender Extremities: no edema, 2+ pulse Skin: no lesions/rashes Neuro: alert and oriented.  Cranial nerves intact.  Normal gait, strength Psych: normal mood, affect, hygiene and grooming  ASSESSMENT/PLAN:  Pernicious anemia - Plan: CBC with Differential, Vitamin B12  Routine general medical examination at a health care  facility - Plan: CANCELED: Flu Vaccine QUAD 36+ mos IM  Tobacco use disorder  Screening for lipoid disorders - Plan: Lipid panel  Other malaise and fatigue - Plan: Comprehensive metabolic panel, CBC with Differential, Vitamin B12  Encounter for long-term (current) use of other medications - Plan: Comprehensive metabolic panel, CBC with Differential, Vitamin B12, cyanocobalamin ((VITAMIN B-12)) injection 1,000 mcg  Need for prophylactic vaccination and inoculation against influenza - Plan: Flu Vaccine QUAD 36+ mos IM  Counseled re: smoking cessation in detail. Labs drawn prior to giving B12 injection today.  Last shot was about 6 weeks ago.  Contact with results--if B12 level and CBC normal, can consider lengthening intervals some (rather than ongoing q month shots).

## 2012-12-17 NOTE — Patient Instructions (Addendum)
We will be in touch with your results in the next few day.  Ensure that you are getting [phone] IU of Vitamin D daily in your vitamin.  Please try and quit smoking--start thinking about why/when you smoke (habit, boredom, stress) in order to come up with effective strategies to cut back or quit. Available resources to help you quit include free counseling through [facility] Quitline ([place]Quitline.[place] or 1-800-QUITNOW), smoking cessation classes through [facility] (call to find out schedule), over-the-counter nicotine replacements, and e-cigarettes (although this may not help break the hand-mouth habit).  Many insurance companies also have smoking cessation programs (which may decrease the cost of patches, meds if enrolled).  If these methods are not effective for you, and you are motivated to quit, return to discuss the possibility of prescription medications.

## 2013-01-07 ENCOUNTER — Ambulatory Visit
Admission: RE | Admit: 2013-01-07 | Discharge: 2013-01-07 | Disposition: A | Discharge status: Home / Self Care | Payer: BC Managed Care – PPO | Source: Ambulatory Visit | Attending: Obstetrics and Gynecology | Admitting: Obstetrics and Gynecology

## 2013-01-07 ENCOUNTER — Other Ambulatory Visit: Payer: Self-pay | Admitting: Obstetrics and Gynecology

## 2013-01-07 DIAGNOSIS — Z780 Asymptomatic menopausal state: Secondary | ICD-10-CM

## 2013-01-07 DIAGNOSIS — E2839 Other primary ovarian failure: Secondary | ICD-10-CM

## 2013-01-29 ENCOUNTER — Other Ambulatory Visit: Payer: Self-pay

## 2013-01-30 ENCOUNTER — Other Ambulatory Visit: Payer: Self-pay | Admitting: *Deleted

## 2013-01-30 ENCOUNTER — Telehealth: Payer: Self-pay | Admitting: *Deleted

## 2013-01-30 DIAGNOSIS — F411 Generalized anxiety disorder: Secondary | ICD-10-CM

## 2013-01-30 MED ORDER — ALPRAZOLAM 0.5 MG PO TABS: 0.2500 mg | ORAL_TABLET | Freq: Three times a day (TID) | ORAL | Status: DC | PRN

## 2013-01-30 NOTE — Telephone Encounter (Signed)
Ok to refill #20 (last filled 05/2012)

## 2013-01-30 NOTE — Telephone Encounter (Signed)
Patient called and asked if you would call her in a refill on alprazolam that was given to her about 6 months ago. She is going on a vacation on a plane and needs to take these when she flies. She does have a few left but feels like she would really like to have a few more on hand for her trip. She has also had a little more anxiety than usual due to her menopause kicking in.

## 2013-02-05 ENCOUNTER — Other Ambulatory Visit (INDEPENDENT_AMBULATORY_CARE_PROVIDER_SITE_OTHER): Payer: BC Managed Care – PPO

## 2013-02-05 DIAGNOSIS — E538 Deficiency of other specified B group vitamins: Secondary | ICD-10-CM

## 2013-02-05 MED ORDER — CYANOCOBALAMIN 1000 MCG/ML IJ SOLN
1000.0000 ug | Freq: Once | INTRAMUSCULAR | Status: AC
  Administered 2013-02-05: 1000 ug via INTRAMUSCULAR

## 2013-04-02 ENCOUNTER — Other Ambulatory Visit (INDEPENDENT_AMBULATORY_CARE_PROVIDER_SITE_OTHER): Payer: BC Managed Care – PPO

## 2013-04-02 DIAGNOSIS — E538 Deficiency of other specified B group vitamins: Secondary | ICD-10-CM

## 2013-04-02 MED ORDER — CYANOCOBALAMIN 1000 MCG/ML IJ SOLN
1000.0000 ug | Freq: Once | INTRAMUSCULAR | Status: AC
  Administered 2013-04-02: 1000 ug via INTRAMUSCULAR

## 2013-05-14 ENCOUNTER — Other Ambulatory Visit (INDEPENDENT_AMBULATORY_CARE_PROVIDER_SITE_OTHER): Payer: BC Managed Care – PPO

## 2013-05-14 DIAGNOSIS — D510 Vitamin B12 deficiency anemia due to intrinsic factor deficiency: Secondary | ICD-10-CM

## 2013-05-14 MED ORDER — CYANOCOBALAMIN 1000 MCG/ML IJ SOLN
1000.0000 ug | Freq: Once | INTRAMUSCULAR | Status: AC
  Administered 2013-05-14: 1000 ug via INTRAMUSCULAR

## 2013-06-13 ENCOUNTER — Other Ambulatory Visit: Payer: Self-pay

## 2013-06-13 DIAGNOSIS — Z1231 Encounter for screening mammogram for malignant neoplasm of breast: Secondary | ICD-10-CM

## 2013-06-25 ENCOUNTER — Other Ambulatory Visit (INDEPENDENT_AMBULATORY_CARE_PROVIDER_SITE_OTHER): Payer: BC Managed Care – PPO

## 2013-06-25 DIAGNOSIS — E538 Deficiency of other specified B group vitamins: Secondary | ICD-10-CM

## 2013-06-25 MED ORDER — CYANOCOBALAMIN 1000 MCG/ML IJ SOLN
1000.0000 ug | Freq: Once | INTRAMUSCULAR | Status: AC
  Administered 2013-06-25: 1000 ug via INTRAMUSCULAR

## 2013-07-23 ENCOUNTER — Ambulatory Visit
Admission: RE | Admit: 2013-07-23 | Discharge: 2013-07-23 | Disposition: A | Discharge status: Home / Self Care | Payer: BC Managed Care – PPO | Source: Ambulatory Visit

## 2013-07-23 DIAGNOSIS — Z1231 Encounter for screening mammogram for malignant neoplasm of breast: Secondary | ICD-10-CM

## 2013-08-06 ENCOUNTER — Other Ambulatory Visit (INDEPENDENT_AMBULATORY_CARE_PROVIDER_SITE_OTHER): Payer: BC Managed Care – PPO

## 2013-08-06 DIAGNOSIS — E538 Deficiency of other specified B group vitamins: Secondary | ICD-10-CM

## 2013-08-06 MED ORDER — CYANOCOBALAMIN 1000 MCG/ML IJ SOLN
1000.0000 ug | Freq: Once | INTRAMUSCULAR | Status: AC
  Administered 2013-08-06: 1000 ug via INTRAMUSCULAR

## 2013-09-12 ENCOUNTER — Other Ambulatory Visit (INDEPENDENT_AMBULATORY_CARE_PROVIDER_SITE_OTHER): Payer: BC Managed Care – PPO

## 2013-09-12 DIAGNOSIS — E538 Deficiency of other specified B group vitamins: Secondary | ICD-10-CM

## 2013-09-12 MED ORDER — CYANOCOBALAMIN 1000 MCG/ML IJ SOLN
1000.0000 ug | Freq: Once | INTRAMUSCULAR | Status: AC
  Administered 2013-09-12: 1000 ug via INTRAMUSCULAR

## 2013-09-17 ENCOUNTER — Other Ambulatory Visit: Payer: BC Managed Care – PPO

## 2013-10-14 ENCOUNTER — Other Ambulatory Visit: Payer: Self-pay

## 2013-10-15 ENCOUNTER — Other Ambulatory Visit (INDEPENDENT_AMBULATORY_CARE_PROVIDER_SITE_OTHER): Payer: BC Managed Care – PPO

## 2013-10-15 DIAGNOSIS — E538 Deficiency of other specified B group vitamins: Secondary | ICD-10-CM

## 2013-10-15 MED ORDER — CYANOCOBALAMIN 1000 MCG/ML IJ SOLN
1000.0000 ug | Freq: Once | INTRAMUSCULAR | Status: AC
  Administered 2013-10-15: 1000 ug via INTRAMUSCULAR

## 2013-11-26 ENCOUNTER — Other Ambulatory Visit (INDEPENDENT_AMBULATORY_CARE_PROVIDER_SITE_OTHER): Payer: BC Managed Care – PPO

## 2013-11-26 DIAGNOSIS — Z23 Encounter for immunization: Secondary | ICD-10-CM

## 2013-11-26 DIAGNOSIS — E538 Deficiency of other specified B group vitamins: Secondary | ICD-10-CM

## 2013-11-26 MED ORDER — CYANOCOBALAMIN 1000 MCG/ML IJ SOLN
1000.0000 ug | Freq: Once | INTRAMUSCULAR | Status: AC
  Administered 2013-11-26: 1000 ug via INTRAMUSCULAR

## 2014-01-07 ENCOUNTER — Other Ambulatory Visit (INDEPENDENT_AMBULATORY_CARE_PROVIDER_SITE_OTHER): Payer: BC Managed Care – PPO

## 2014-01-07 DIAGNOSIS — E538 Deficiency of other specified B group vitamins: Secondary | ICD-10-CM

## 2014-01-07 MED ORDER — CYANOCOBALAMIN 1000 MCG/ML IJ SOLN
1000.0000 ug | Freq: Once | INTRAMUSCULAR | Status: AC
  Administered 2014-01-07: 1000 ug via INTRAMUSCULAR

## 2014-02-18 ENCOUNTER — Other Ambulatory Visit (INDEPENDENT_AMBULATORY_CARE_PROVIDER_SITE_OTHER): Payer: BC Managed Care – PPO

## 2014-02-18 DIAGNOSIS — E538 Deficiency of other specified B group vitamins: Secondary | ICD-10-CM

## 2014-02-18 MED ORDER — CYANOCOBALAMIN 1000 MCG/ML IJ SOLN
1000.0000 ug | Freq: Once | INTRAMUSCULAR | Status: AC
  Administered 2014-02-18: 1000 ug via INTRAMUSCULAR

## 2014-04-01 ENCOUNTER — Other Ambulatory Visit (INDEPENDENT_AMBULATORY_CARE_PROVIDER_SITE_OTHER): Payer: BLUE CROSS/BLUE SHIELD

## 2014-04-01 DIAGNOSIS — E538 Deficiency of other specified B group vitamins: Secondary | ICD-10-CM

## 2014-04-01 MED ORDER — CYANOCOBALAMIN 1000 MCG/ML IJ SOLN
1000.0000 ug | Freq: Once | INTRAMUSCULAR | Status: AC
  Administered 2014-04-01: 1000 ug via INTRAMUSCULAR

## 2014-05-13 ENCOUNTER — Other Ambulatory Visit (INDEPENDENT_AMBULATORY_CARE_PROVIDER_SITE_OTHER): Payer: BLUE CROSS/BLUE SHIELD

## 2014-05-13 DIAGNOSIS — E538 Deficiency of other specified B group vitamins: Secondary | ICD-10-CM

## 2014-05-13 MED ORDER — CYANOCOBALAMIN 1000 MCG/ML IJ SOLN
1000.0000 ug | Freq: Once | INTRAMUSCULAR | Status: AC
  Administered 2014-05-13: 1000 ug via INTRAMUSCULAR

## 2014-06-23 ENCOUNTER — Other Ambulatory Visit: Payer: Self-pay

## 2014-06-23 DIAGNOSIS — Z1231 Encounter for screening mammogram for malignant neoplasm of breast: Secondary | ICD-10-CM

## 2014-06-24 ENCOUNTER — Other Ambulatory Visit (INDEPENDENT_AMBULATORY_CARE_PROVIDER_SITE_OTHER): Payer: BLUE CROSS/BLUE SHIELD

## 2014-06-24 DIAGNOSIS — E538 Deficiency of other specified B group vitamins: Secondary | ICD-10-CM | POA: Diagnosis not present

## 2014-06-24 MED ORDER — CYANOCOBALAMIN 1000 MCG/ML IJ SOLN
1000.0000 ug | Freq: Once | INTRAMUSCULAR | Status: AC
  Administered 2014-06-24: 1000 ug via INTRAMUSCULAR

## 2014-08-05 ENCOUNTER — Other Ambulatory Visit (INDEPENDENT_AMBULATORY_CARE_PROVIDER_SITE_OTHER): Payer: BLUE CROSS/BLUE SHIELD

## 2014-08-05 DIAGNOSIS — E538 Deficiency of other specified B group vitamins: Secondary | ICD-10-CM | POA: Diagnosis not present

## 2014-08-05 MED ORDER — CYANOCOBALAMIN 1000 MCG/ML IJ SOLN
1000.0000 ug | Freq: Once | INTRAMUSCULAR | Status: AC
  Administered 2014-08-05: 1000 ug via INTRAMUSCULAR

## 2014-08-07 ENCOUNTER — Ambulatory Visit
Admission: RE | Admit: 2014-08-07 | Discharge: 2014-08-07 | Disposition: A | Discharge status: Home / Self Care | Payer: BLUE CROSS/BLUE SHIELD | Source: Ambulatory Visit

## 2014-08-07 DIAGNOSIS — Z1231 Encounter for screening mammogram for malignant neoplasm of breast: Secondary | ICD-10-CM

## 2014-09-09 ENCOUNTER — Other Ambulatory Visit (INDEPENDENT_AMBULATORY_CARE_PROVIDER_SITE_OTHER): Payer: BLUE CROSS/BLUE SHIELD

## 2014-09-09 DIAGNOSIS — E538 Deficiency of other specified B group vitamins: Secondary | ICD-10-CM

## 2014-09-09 MED ORDER — CYANOCOBALAMIN 1000 MCG/ML IJ SOLN
1000.0000 ug | Freq: Once | INTRAMUSCULAR | Status: AC
  Administered 2014-09-09: 1000 ug via INTRAMUSCULAR

## 2014-10-23 ENCOUNTER — Other Ambulatory Visit (INDEPENDENT_AMBULATORY_CARE_PROVIDER_SITE_OTHER): Payer: BLUE CROSS/BLUE SHIELD

## 2014-10-23 DIAGNOSIS — D510 Vitamin B12 deficiency anemia due to intrinsic factor deficiency: Secondary | ICD-10-CM | POA: Diagnosis not present

## 2014-10-23 MED ORDER — CYANOCOBALAMIN 1000 MCG/ML IJ SOLN
1000.0000 ug | Freq: Once | INTRAMUSCULAR | Status: AC
  Administered 2014-10-23: 1000 ug via INTRAMUSCULAR

## 2014-11-10 ENCOUNTER — Ambulatory Visit (INDEPENDENT_AMBULATORY_CARE_PROVIDER_SITE_OTHER): Payer: BLUE CROSS/BLUE SHIELD | Admitting: Family Medicine

## 2014-11-10 ENCOUNTER — Encounter: Payer: Self-pay | Admitting: Family Medicine

## 2014-11-10 VITALS — BP 124/68 | HR 64 | Ht 66.5 in | Wt 123.2 lb

## 2014-11-10 DIAGNOSIS — Z720 Tobacco use: Secondary | ICD-10-CM | POA: Diagnosis not present

## 2014-11-10 DIAGNOSIS — D510 Vitamin B12 deficiency anemia due to intrinsic factor deficiency: Secondary | ICD-10-CM | POA: Diagnosis not present

## 2014-11-10 DIAGNOSIS — R5383 Other fatigue: Secondary | ICD-10-CM | POA: Diagnosis not present

## 2014-11-10 DIAGNOSIS — Z23 Encounter for immunization: Secondary | ICD-10-CM | POA: Diagnosis not present

## 2014-11-10 DIAGNOSIS — K219 Gastro-esophageal reflux disease without esophagitis: Secondary | ICD-10-CM | POA: Diagnosis not present

## 2014-11-10 DIAGNOSIS — F17200 Nicotine dependence, unspecified, uncomplicated: Secondary | ICD-10-CM

## 2014-11-10 DIAGNOSIS — F411 Generalized anxiety disorder: Secondary | ICD-10-CM | POA: Diagnosis not present

## 2014-11-10 LAB — CBC WITH DIFFERENTIAL/PLATELET
Basophils Absolute: 0 10*3/uL (ref 0.0–0.1)
Basophils Relative: 0 % (ref 0–1)
EOS ABS: 0.1 10*3/uL (ref 0.0–0.7)
EOS PCT: 2 % (ref 0–5)
HEMATOCRIT: 37.8 % (ref 36.0–46.0)
Hemoglobin: 12.8 g/dL (ref 12.0–15.0)
LYMPHS ABS: 1.5 10*3/uL (ref 0.7–4.0)
LYMPHS PCT: 29 % (ref 12–46)
MCH: 33.2 pg (ref 26.0–34.0)
MCHC: 33.9 g/dL (ref 30.0–36.0)
MCV: 98.2 fL (ref 78.0–100.0)
MPV: 10.4 fL (ref 8.6–12.4)
Monocytes Absolute: 0.4 10*3/uL (ref 0.1–1.0)
Monocytes Relative: 8 % (ref 3–12)
Neutro Abs: 3.1 10*3/uL (ref 1.7–7.7)
Neutrophils Relative %: 61 % (ref 43–77)
Platelets: 240 10*3/uL (ref 150–400)
RBC: 3.85 MIL/uL — ABNORMAL LOW (ref 3.87–5.11)
RDW: 12.8 % (ref 11.5–15.5)
WBC: 5.1 10*3/uL (ref 4.0–10.5)

## 2014-11-10 LAB — TSH: TSH: 1.971 u[IU]/mL (ref 0.350–4.500)

## 2014-11-10 LAB — VITAMIN B12: Vitamin B-12: 480 pg/mL (ref 211–911)

## 2014-11-10 MED ORDER — ALPRAZOLAM 0.5 MG PO TABS: 0.2500 mg | ORAL_TABLET | Freq: Three times a day (TID) | ORAL | Status: DC | PRN

## 2014-11-10 NOTE — Progress Notes (Signed)
Chief Complaint  Patient presents with  . Med check    for B12.   Patient presents for follow-up. She hasn't been seen in about 2 years.  She has no complaints.  She has been getting regular B12 shots.  GERD--she had being doing well off Pepcid for quite a while, but recently started having heartburn.  She has been taking pepcid once daily in the morning with good results.  Change in diet includes drinking a little more wine since her eldest son went off to college recently.  Anxiety--really no longer having any anxiety, except related to flying.  She needs refill on alprazolam.  Tobacco dependence--she continues, along with her husband, to smoke.  She previously had some success with the e-cigarette.  Also had done well for a while when keeping cigarettes in the trunk when driving.  Currently smokes about 1/2 PPD, sometimes more, sometimes less. It is mostly out of habit, rather than from stress.   B12 deficiency:  She has been getting B12 injections every 5-6 weeks, and is doing well.  Denies numbness/tingling in hands/feet, other than fingertips getting numb when she gets cold, usually when prolonged shopping trip in the summer (flip flops, in a cold store).  They don't turn colors.Denies memory problems or fatigue. Sometimes she will look for her reading glasses that are on her head, minor things, which she blames on menopause.  Energy is okay, but related to her sleep (sometimes tired if not sleeping well), some anxiety related to her son being away.  Last B12 shot was 2 weeks ago, coming every 5-6 weeks.  After getting shot, she feels a little peppier, but doesn't have significant fatigue prior to her shot.  PMH, PSH, SH and FH were reviewed and updated today.  Outpatient Encounter Prescriptions as of 11/10/2014  Medication Sig Note  . cetirizine (ZYRTEC) 10 MG tablet Take 10 mg by mouth daily.     . Cholecalciferol (VITAMIN D) 2000 UNITS tablet Take 2,000 Units by mouth daily.   .  Famotidine (PEPCID PO) Take by mouth. 11/10/2014: Using it daily x 3 weeks  . Multiple Vitamin (MULITIVITAMIN WITH MINERALS) TABS Take 1 tablet by mouth daily.   . vitamin C (ASCORBIC ACID) 500 MG tablet Take 500 mg by mouth daily.   . [DISCONTINUED] FINACEA 15 % cream Apply 1 application topically daily as needed. For rosacea flare ups   . ALPRAZolam (XANAX) 0.5 MG tablet Take 0.5-1 tablets (0.25-0.5 mg total) by mouth 3 (three) times daily as needed for sleep or anxiety.   Marland Kitchen ibuprofen (ADVIL,MOTRIN) 200 MG tablet Take 200-600 mg by mouth every 6 (six) hours as needed. For headache or pain   . [DISCONTINUED] ALPRAZolam (XANAX) 0.5 MG tablet Take 0.5-1 tablets (0.25-0.5 mg total) by mouth 3 (three) times daily as needed for sleep or anxiety. (Patient not taking: Reported on 11/10/2014) 11/10/2014: Uses for flying   Facility-Administered Encounter Medications as of 11/10/2014  Medication  . vitamin B-12 (CYANOCOBALAMIN) tablet 1,000 mcg  . vitamin B-12 (CYANOCOBALAMIN) tablet 1,000 mcg   No Known Allergies  ROS:  No weight changes, fever, chills, URI symptoms, cough, shortness of breath, chest pain, palpitations.  +heartburn as per HPI.  No bowel changes, urinary complaints, bleeding, bruising, rash, joint pains, depression or other concerns.  See HPI.  PHYSICAL EXAM: BP 124/68 mmHg  Pulse 64  Ht 5' 6.5" (1.689 m)  Wt 123 lb 3.2 oz (55.883 kg)  BMI 19.59 kg/m2  LMP 10/05/2011  Well developed,  pleasant, thin female in no distress HEENT: PERRL, EOMI, conjunctiva clear, OP clear Neck: no lymphadenopathy, thyromegaly, mass or bruit Heart: regular rate and rhythm without murmur Lungs: clear bilaterally Back: no spinal or CVA tenderness Abdomen: soft, nontender, no organomegaly or mass Extremities: no edema, normal pulses Skin: no lesions/rashes visible Psych: normal mood, affect, hygiene and grooming Neuro: alert and oriented. Normal strength, gait, cranial nerves   Lab Results   Component Value Date   CHOL 189 12/17/2012   HDL 54 12/17/2012   LDLCALC 118* 12/17/2012   TRIG 86 12/17/2012   CHOLHDL 3.5 12/17/2012   ASSESSMENT/PLAN:  Pernicious anemia - Doing well on B12 injections, due for labs - Plan: CBC with Differential/Platelet, Vitamin B12  Tobacco use disorder - counseled extensively re: risks and ways to quit. r/b Chantix reviewed, to consider. Quit with husband encouraged  Other fatigue - Plan: CBC with Differential/Platelet, TSH  Need for prophylactic vaccination and inoculation against influenza - Plan: Flu Vaccine QUAD 36+ mos PF IM (Fluarix & Fluzone Quad PF)  Anxiety state - related to flying, refill xanax.  Some anxiety related to son leaving for college - Plan: ALPRAZolam (XANAX) 0.5 MG tablet  Gastroesophageal reflux disease without esophagitis - continue Pepcid prn--counseled re: diet; cut back on alcohol, quit smoking   F/u 1 year for CPE (see GYN, so not needed this year, HM currently UTD, reviewed)

## 2014-11-10 NOTE — Patient Instructions (Signed)
Please try and quit smoking--start thinking about why/when you smoke (habit, boredom, stress) in order to come up with effective strategies to cut back or quit. Available resources to help you quit include free counseling through [facility] Quitline ([place]uit[facility] or 1-800-QUITNOW), smoking cessation classes through [facility] (call to find out schedule), over-the-counter nicotine replacements, and e-cigarettes (although this may not help break the hand-mouth habit).  Many insurance companies also have smoking cessation programs (which may decrease the cost of patches, meds if enrolled).  If these methods are not effective for you, and you are motivated to quit, return to discuss the possibility of prescription medications.  Let me know if you have any interest in starting Chantix.  We will be in touch with your results in the next 1-2 days.

## 2014-12-11 ENCOUNTER — Other Ambulatory Visit (INDEPENDENT_AMBULATORY_CARE_PROVIDER_SITE_OTHER): Payer: BLUE CROSS/BLUE SHIELD

## 2014-12-11 DIAGNOSIS — D510 Vitamin B12 deficiency anemia due to intrinsic factor deficiency: Secondary | ICD-10-CM | POA: Diagnosis not present

## 2014-12-11 MED ORDER — CYANOCOBALAMIN 1000 MCG/ML IJ SOLN
1000.0000 ug | Freq: Once | INTRAMUSCULAR | Status: AC
  Administered 2014-12-11: 1000 ug via INTRAMUSCULAR

## 2015-01-03 ENCOUNTER — Other Ambulatory Visit: Payer: Self-pay | Admitting: Obstetrics and Gynecology

## 2015-01-03 DIAGNOSIS — M8580 Other specified disorders of bone density and structure, unspecified site: Secondary | ICD-10-CM

## 2015-01-15 ENCOUNTER — Other Ambulatory Visit (INDEPENDENT_AMBULATORY_CARE_PROVIDER_SITE_OTHER): Payer: BLUE CROSS/BLUE SHIELD

## 2015-01-15 DIAGNOSIS — E538 Deficiency of other specified B group vitamins: Secondary | ICD-10-CM

## 2015-01-15 MED ORDER — CYANOCOBALAMIN 1000 MCG/ML IJ SOLN
1000.0000 ug | Freq: Once | INTRAMUSCULAR | Status: AC
  Administered 2015-01-15: 1000 ug via INTRAMUSCULAR

## 2015-02-16 ENCOUNTER — Inpatient Hospital Stay
Admission: RE | Admit: 2015-02-16 | Discharge: 2015-02-16 | Disposition: A | Discharge status: Home / Self Care | Payer: BLUE CROSS/BLUE SHIELD | Source: Ambulatory Visit | Attending: Obstetrics and Gynecology | Admitting: Obstetrics and Gynecology

## 2015-02-19 ENCOUNTER — Other Ambulatory Visit (INDEPENDENT_AMBULATORY_CARE_PROVIDER_SITE_OTHER): Payer: BLUE CROSS/BLUE SHIELD

## 2015-02-19 DIAGNOSIS — D649 Anemia, unspecified: Secondary | ICD-10-CM | POA: Diagnosis not present

## 2015-02-19 MED ORDER — CYANOCOBALAMIN 1000 MCG/ML IJ SOLN
1000.0000 ug | Freq: Once | INTRAMUSCULAR | Status: AC
  Administered 2015-02-19: 1000 ug via INTRAMUSCULAR

## 2015-03-19 ENCOUNTER — Ambulatory Visit
Admission: RE | Admit: 2015-03-19 | Discharge: 2015-03-19 | Disposition: A | Discharge status: Home / Self Care | Payer: BLUE CROSS/BLUE SHIELD | Source: Ambulatory Visit | Attending: Obstetrics and Gynecology | Admitting: Obstetrics and Gynecology

## 2015-03-19 DIAGNOSIS — M8580 Other specified disorders of bone density and structure, unspecified site: Secondary | ICD-10-CM

## 2015-04-01 ENCOUNTER — Other Ambulatory Visit (INDEPENDENT_AMBULATORY_CARE_PROVIDER_SITE_OTHER): Payer: BLUE CROSS/BLUE SHIELD

## 2015-04-01 DIAGNOSIS — D649 Anemia, unspecified: Secondary | ICD-10-CM | POA: Diagnosis not present

## 2015-04-01 MED ORDER — CYANOCOBALAMIN 1000 MCG/ML IJ SOLN
1000.0000 ug | Freq: Once | INTRAMUSCULAR | Status: AC
  Administered 2015-04-01: 1000 ug via INTRAMUSCULAR

## 2015-05-01 ENCOUNTER — Encounter: Payer: Self-pay | Admitting: Family Medicine

## 2015-05-04 ENCOUNTER — Other Ambulatory Visit (INDEPENDENT_AMBULATORY_CARE_PROVIDER_SITE_OTHER): Payer: BLUE CROSS/BLUE SHIELD

## 2015-05-04 DIAGNOSIS — E538 Deficiency of other specified B group vitamins: Secondary | ICD-10-CM | POA: Diagnosis not present

## 2015-05-04 MED ORDER — CYANOCOBALAMIN 1000 MCG/ML IJ SOLN
1000.0000 ug | Freq: Once | INTRAMUSCULAR | Status: AC
  Administered 2015-05-04: 1000 ug via INTRAMUSCULAR

## 2015-05-05 ENCOUNTER — Other Ambulatory Visit: Payer: BLUE CROSS/BLUE SHIELD

## 2015-06-16 ENCOUNTER — Other Ambulatory Visit (INDEPENDENT_AMBULATORY_CARE_PROVIDER_SITE_OTHER): Payer: BLUE CROSS/BLUE SHIELD

## 2015-06-16 DIAGNOSIS — E538 Deficiency of other specified B group vitamins: Secondary | ICD-10-CM | POA: Diagnosis not present

## 2015-06-16 MED ORDER — CYANOCOBALAMIN 1000 MCG/ML IJ SOLN
1000.0000 ug | Freq: Once | INTRAMUSCULAR | Status: AC
Start: 2015-06-16 — End: 2015-06-16
  Administered 2015-06-16: 1000 ug via INTRAMUSCULAR

## 2015-07-15 ENCOUNTER — Other Ambulatory Visit: Payer: Self-pay

## 2015-07-15 DIAGNOSIS — Z1231 Encounter for screening mammogram for malignant neoplasm of breast: Secondary | ICD-10-CM

## 2015-07-21 ENCOUNTER — Other Ambulatory Visit (INDEPENDENT_AMBULATORY_CARE_PROVIDER_SITE_OTHER): Payer: BLUE CROSS/BLUE SHIELD

## 2015-07-21 DIAGNOSIS — E538 Deficiency of other specified B group vitamins: Secondary | ICD-10-CM

## 2015-07-21 MED ORDER — CYANOCOBALAMIN 1000 MCG/ML IJ SOLN
1000.0000 ug | Freq: Once | INTRAMUSCULAR | Status: AC
  Administered 2015-07-21: 1000 ug via INTRAMUSCULAR

## 2015-08-25 ENCOUNTER — Other Ambulatory Visit (INDEPENDENT_AMBULATORY_CARE_PROVIDER_SITE_OTHER): Payer: BLUE CROSS/BLUE SHIELD

## 2015-08-25 DIAGNOSIS — E538 Deficiency of other specified B group vitamins: Secondary | ICD-10-CM | POA: Diagnosis not present

## 2015-08-25 MED ORDER — CYANOCOBALAMIN 1000 MCG/ML IJ SOLN
1000.0000 ug | Freq: Once | INTRAMUSCULAR | Status: AC
  Administered 2015-08-25: 1000 ug via INTRAMUSCULAR

## 2015-08-27 ENCOUNTER — Ambulatory Visit
Admission: RE | Admit: 2015-08-27 | Discharge: 2015-08-27 | Disposition: A | Discharge status: Home / Self Care | Payer: BLUE CROSS/BLUE SHIELD | Source: Ambulatory Visit

## 2015-08-27 DIAGNOSIS — W57XXXA Bitten or stung by nonvenomous insect and other nonvenomous arthropods, initial encounter: Secondary | ICD-10-CM | POA: Diagnosis not present

## 2015-08-27 DIAGNOSIS — Z1231 Encounter for screening mammogram for malignant neoplasm of breast: Secondary | ICD-10-CM

## 2015-08-27 DIAGNOSIS — D485 Neoplasm of uncertain behavior of skin: Secondary | ICD-10-CM | POA: Diagnosis not present

## 2015-08-27 DIAGNOSIS — D2262 Melanocytic nevi of left upper limb, including shoulder: Secondary | ICD-10-CM | POA: Diagnosis not present

## 2015-08-27 DIAGNOSIS — D225 Melanocytic nevi of trunk: Secondary | ICD-10-CM | POA: Diagnosis not present

## 2015-09-28 ENCOUNTER — Other Ambulatory Visit (INDEPENDENT_AMBULATORY_CARE_PROVIDER_SITE_OTHER): Payer: BLUE CROSS/BLUE SHIELD

## 2015-09-28 DIAGNOSIS — D509 Iron deficiency anemia, unspecified: Secondary | ICD-10-CM

## 2015-09-28 MED ORDER — CYANOCOBALAMIN 1000 MCG/ML IJ SOLN: 1000.0000 ug | Freq: Once | INTRAMUSCULAR | 0 refills | Status: DC

## 2015-09-28 MED ORDER — CYANOCOBALAMIN 1000 MCG/ML IJ SOLN
1000.0000 ug | Freq: Once | INTRAMUSCULAR | Status: AC
  Administered 2015-09-28: 1000 ug via INTRAMUSCULAR

## 2015-10-02 ENCOUNTER — Other Ambulatory Visit: Payer: BLUE CROSS/BLUE SHIELD

## 2015-11-10 NOTE — Progress Notes (Signed)
Chief Complaint  Patient presents with  . Annual Exam    fasting annual exam (coffee with a little sugar) no gyn exam-sees Dr. Ronita Hipps and is UTD. Has a nodule on the base of her right thumb she would like you to look at-been there about 5 months(was growing but is now stable). Sinus pain and pressure started this past Monday. Mucus is clear, no fevers.    Lindsay Chang is a 55 y.o. female who presents for a complete physical.  She has the following concerns:  2 days ago she started with cold symptoms.  Feeling more fatigued, head congestion, postnasal drainage.  She took Mucinex yesterday.  No fever, chills, mucus is not discolored.  She noticed a lump/bony prominence at the base of the right thumb. She first noticed it about 6 months ago, recently noted that it has gotten larger. It is sensitive if it is bumped.  Hasn't grown recently, feels firm, and is moveable.  B12 deficiency:  She has been getting B12 injections every 5-6 weeks, and is doing well.  Denies numbness/tingling in hands/feet, other than fingertips getting numb when she gets cold (like in grocery store). Brother has Raynaud's, but she never sees color change when they feel numb. Denies memory problems or fatigue (minor). Sometimes will forget why she walked into a room, but nothing major.    Last B12 shot was 7/31.  After getting shot, she feels a little peppier, but doesn't have significant fatigue prior to her shot. Feels fine now (6 weeks from last injection).  Anxiety is much less--son is now at [facility] (came home from [facility]). Other son is a Equities trader in [employer]. Only now has anxiety related to flying.  GERD--overall doing well.  Has heartburn related to the foods she eats; uses pepcid about 1-2x/week.  No longer drinking as much wine (which triggered daily symptoms at last visit), since not as stressed.  Tobacco dependence--she continues, along with her husband, to smoke. She continues to keep cigarettes in the trunk when driving  (not all the time).  Currently smokes about 1/2 PPD at most, mostly just on the weekends. Only 2-3/day on the weekdays. It is mostly out of habit, rather than from stress.  She admits that she needs to get her husband to quit with her to be successful.   Immunization History  Administered Date(s) Administered  . DTaP 10/28/2008  . Influenza Split 11/30/2010, 12/15/2011  . Influenza,inj,Quad PF,36+ Mos 12/17/2012, 11/26/2013, 11/10/2014   Last Pap smear: last year, with Dr. Ronita Hipps Last mammogram: 07/2015 Last colonoscopy: 06/2012--Dr. Fuller Plan.  External hemorrhoids, sigmoid diverticulosis, hyperplastic polyp Last DEXA: 03/2015 T-1.9 at L fem neck Dentist: twice a year Ophtho: yearly, scheduled in 2 weeks Exercise: yoga 4-5 x/week; walks with dog 4-5 times/week usually (not in the last few months).  Vitamin D screen--level was 33 in 2014 Lipid screen: Lab Results  Component Value Date   CHOL 189 12/17/2012   HDL 54 12/17/2012   LDLCALC 118 (H) 12/17/2012   TRIG 86 12/17/2012   CHOLHDL 3.5 12/17/2012   Past Medical History:  Diagnosis Date  . Allergy   . Anxiety   . B12 deficiency   . Colon polyp, hyperplastic 06/2012  . Rosacea    Dr. Delman Cheadle    Past Surgical History:  Procedure Laterality Date  . COLONOSCOPY  06/2012   Dr. Fuller Plan  . rectal fissure  2002    Social History   Social History  . Marital status: Married  Spouse name: N/A  . Number of children: 2  . Years of education: N/A   Occupational History  . Yoga instructor    Social History Main Topics  . Smoking status: Current Every Day Smoker    Packs/day: 0.50    Types: Cigarettes  . Smokeless tobacco: Never Used     Comment: up to 1/2PPD on weekends, only 2-3/d during week  . Alcohol use 1.8 - 2.4 oz/week    3 - 4 Glasses of wine per week     Comment: 3-4 glasses of wine/week (1 glass)  . Drug use: No  . Sexual activity: Yes    Birth control/ protection: Other-see comments     Comment: husband with  vasectomy   Other Topics Concern  . Not on file   Social History Narrative   Verta Ellen daughter. 2 sons (at [employer], [place] (only stayed at [facility] state 1 yr).  Husband also smokes.   1 dog, 1 cat    Family History  Problem Relation Age of Onset  . Breast cancer Mother   . Graves' disease Mother   . Hyperlipidemia Mother   . Cancer Mother     breast and CLL (late 83's)  . Leukemia Mother   . Hypertension Father   . Anxiety disorder Father   . Thyroid disease Brother   . Raynaud syndrome Brother   . Colon cancer Maternal Grandmother 2  . Cancer Maternal Grandmother     colon, skin  . Aortic aneurysm Maternal Grandfather   . Lung cancer Maternal Grandfather   . Diabetes Neg Hx     Outpatient Encounter Prescriptions as of 11/11/2015  Medication Sig Note  . cetirizine (ZYRTEC) 10 MG tablet Take 10 mg by mouth daily.     . Famotidine (PEPCID PO) Take by mouth. 11/11/2015: Using prn, about 1-2x/week  . ibuprofen (ADVIL,MOTRIN) 200 MG tablet Take 200-600 mg by mouth every 6 (six) hours as needed. For headache or pain   . Multiple Vitamin (MULITIVITAMIN WITH MINERALS) TABS Take 1 tablet by mouth daily.   . Multiple Vitamins-Minerals (HAIR SKIN AND NAILS FORMULA PO) Take 2 each by mouth daily.   Marland Kitchen Phenylephrine-APAP-Guaifenesin (MUCINEX FAST-MAX COLD & SINUS PO) Take 20 mLs by mouth 2 (two) times daily.   . vitamin C (ASCORBIC ACID) 500 MG tablet Take 500 mg by mouth daily.   Marland Kitchen ALPRAZolam (XANAX) 0.5 MG tablet Take 0.5-1 tablets (0.25-0.5 mg total) by mouth 3 (three) times daily as needed for sleep or anxiety. (Patient not taking: Reported on 11/11/2015)   . Cholecalciferol (VITAMIN D) 2000 UNITS tablet Take 2,000 Units by mouth daily.   . [EXPIRED] cyanocobalamin ((VITAMIN B-12)) injection 1,000 mcg     No facility-administered encounter medications on file as of 11/11/2015.    No Known Allergies  ROS:  She denies anorexia, fever, weight changes, headaches,  vision  changes, decreased hearing, ear pain, sore throat, breast concerns, chest pain, syncope, dyspnea on exertion, cough, swelling, nausea, vomiting, diarrhea, constipation, abdominal pain, melena, hematochezia, indigestion/heartburn, hematuria, incontinence, dysuria, vaginal bleeding, discharge, odor or itch, genital lesions, joint pains, numbness, tingling, weakness, tremor, suspicious skin lesions, depression, abnormal bleeding/bruising, or enlarged lymph nodes. Heartburn occasionally, as per HPI Sees dermatologist regularly for skin checks and treatment of rosacea. Irritated SK was removed from breast in February 2017, and another one taken off her back recently. Hot flashes recurred, occasional, tolerable.   PHYSICAL EXAM:  BP 110/60 (BP Location: Left Arm, Patient Position: Sitting,  Cuff Size: Normal)   Pulse 72   Temp 97.7 F (36.5 C) (Tympanic)   Ht '5\' 7"'  (1.702 m)   Wt 121 lb 3.2 oz (55 kg)   LMP 10/05/2011   BMI 18.98 kg/m    General Appearance:    Alert, cooperative, no distress, appears stated age  Head:    Normocephalic, without obvious abnormality, atraumatic  Eyes:    PERRL, conjunctiva/corneas clear, EOM's intact, fundi benign  Ears:    Normal TM's and external ear canals  Nose:   Nares normal, mucosa mildly edematous, no purulent drainage or sinus tenderness  Throat:   Lips, mucosa, and tongue normal; teeth and gums normal  Neck:   Supple, no lymphadenopathy;  thyroid:  no   enlargement/tenderness/nodules; no carotid bruit or JVD  Back:    Spine nontender, no curvature, ROM normal, no CVA     tenderness  Lungs:     Clear to auscultation bilaterally without wheezes, rales or ronchi; respirations unlabored  Chest Wall:    No tenderness or deformity   Heart:    Regular rate and rhythm, S1 and S2 normal, no murmur, rub or gallop  Breast Exam:    Deferred to GYN  Abdomen:     Soft, non-tender, nondistended, normoactive bowel sounds, no masses, no hepatosplenomegaly  Genitalia:     Deferred to GYN     Extremities:   No clubbing, cyanosis or edema. 7 x 95m mobile, minimally tender subcutaneous mass at the palmar aspect of the right 1st MCP  Pulses:   2+ and symmetric all extremities  Skin:   Skin color, texture, turgor normal, no rashes or lesions  Lymph nodes:   Cervical, and supraclavicular nodes normal  Neurologic:   CNII-XII intact, normal strength, sensation and gait; reflexes 2+ and symmetric throughout                 Psych:  Normal mood, affect, hygiene and grooming   ASSESSMENT/PLAN:   Annual physical exam - Plan: Visual acuity screening, POCT Urinalysis Dipstick, Lipid panel, Comprehensive metabolic panel, CBC with Differential/Platelet, VITAMIN D 25 Hydroxy (Vit-D Deficiency, Fractures), TSH, Vitamin B12, Hepatitis C antibody  Pernicious anemia - Plan: CBC with Differential/Platelet, Vitamin B12, cyanocobalamin ((VITAMIN B-12)) injection 1,000 mcg  Tobacco use disorder - counseled, encouraged cessation (to do along with her husband); risks reviewed; pneumovax given - Plan: Pneumococcal polysaccharide vaccine 23-valent greater than or equal to 2yo subcutaneous/IM  Gastroesophageal reflux disease without esophagitis - discussed use of H2 blocker prior to foods that cause symptoms, rather than after  Need for prophylactic vaccination and inoculation against influenza  Immunization due - Plan: Pneumococcal polysaccharide vaccine 23-valent greater than or equal to 2yo subcutaneous/IM, Flu Vaccine QUAD 36+ mos PF IM (Fluarix & Fluzone Quad PF)  Need for hepatitis C screening test - Plan: Hepatitis C antibody  Osteopenia - encouraged tobacco cessation, weight bearing exercise, discussed Ca and D - Plan: VITAMIN D 25 Hydroxy (Vit-D Deficiency, Fractures)  Vitamin D deficiency - Plan: VITAMIN D 25 Hydroxy (Vit-D Deficiency, Fractures)  Urinary frequency - Plan: Urine culture  Abnormal urinalysis - Plan: Urine culture  Digital mucinous cyst -  reassured/supportive measures. Consider cortisone injection or referral if increasing in size or pain   Hep C Ab, B12, CBC, c-met, lipids, TSH, Vit D  Discussed monthly self breast exams and yearly mammograms; at least 30 minutes of aerobic activity at least 5 days/week, weight bearing exercise at least 2x/week; proper sunscreen use reviewed; healthy diet,  including goals of calcium and vitamin D intake and alcohol recommendations (less than or equal to 1 drink/day) reviewed; regular seatbelt use; changing batteries in smoke detectors.  Immunization recommendations discussed--flu shot, pneumovax given (due to smoking).  Colonoscopy recommendations reviewed, UTD. Due again 06/2022  Counseled re: smoking cessation.

## 2015-11-11 ENCOUNTER — Ambulatory Visit (INDEPENDENT_AMBULATORY_CARE_PROVIDER_SITE_OTHER): Payer: BLUE CROSS/BLUE SHIELD | Admitting: Family Medicine

## 2015-11-11 ENCOUNTER — Encounter: Payer: Self-pay | Admitting: Family Medicine

## 2015-11-11 VITALS — BP 110/60 | HR 72 | Temp 97.7°F | Ht 67.0 in | Wt 121.2 lb

## 2015-11-11 DIAGNOSIS — Z1159 Encounter for screening for other viral diseases: Secondary | ICD-10-CM

## 2015-11-11 DIAGNOSIS — K219 Gastro-esophageal reflux disease without esophagitis: Secondary | ICD-10-CM

## 2015-11-11 DIAGNOSIS — Z23 Encounter for immunization: Secondary | ICD-10-CM

## 2015-11-11 DIAGNOSIS — M6740 Ganglion, unspecified site: Secondary | ICD-10-CM

## 2015-11-11 DIAGNOSIS — D510 Vitamin B12 deficiency anemia due to intrinsic factor deficiency: Secondary | ICD-10-CM | POA: Diagnosis not present

## 2015-11-11 DIAGNOSIS — R350 Frequency of micturition: Secondary | ICD-10-CM

## 2015-11-11 DIAGNOSIS — R8290 Unspecified abnormal findings in urine: Secondary | ICD-10-CM

## 2015-11-11 DIAGNOSIS — M67449 Ganglion, unspecified hand: Secondary | ICD-10-CM

## 2015-11-11 DIAGNOSIS — E559 Vitamin D deficiency, unspecified: Secondary | ICD-10-CM | POA: Diagnosis not present

## 2015-11-11 DIAGNOSIS — Z0000 Encounter for general adult medical examination without abnormal findings: Secondary | ICD-10-CM

## 2015-11-11 DIAGNOSIS — F17200 Nicotine dependence, unspecified, uncomplicated: Secondary | ICD-10-CM

## 2015-11-11 DIAGNOSIS — M8580 Other specified disorders of bone density and structure, unspecified site: Secondary | ICD-10-CM | POA: Diagnosis not present

## 2015-11-11 LAB — COMPREHENSIVE METABOLIC PANEL
ALT: 12 U/L (ref 6–29)
AST: 20 U/L (ref 10–35)
Albumin: 4.2 g/dL (ref 3.6–5.1)
Alkaline Phosphatase: 53 U/L (ref 33–130)
BUN: 6 mg/dL — AB (ref 7–25)
CHLORIDE: 105 mmol/L (ref 98–110)
CO2: 26 mmol/L (ref 20–31)
CREATININE: 0.71 mg/dL (ref 0.50–1.05)
Calcium: 9.4 mg/dL (ref 8.6–10.4)
Glucose, Bld: 91 mg/dL (ref 65–99)
POTASSIUM: 4.4 mmol/L (ref 3.5–5.3)
SODIUM: 139 mmol/L (ref 135–146)
Total Bilirubin: 0.5 mg/dL (ref 0.2–1.2)
Total Protein: 7.7 g/dL (ref 6.1–8.1)

## 2015-11-11 LAB — CBC WITH DIFFERENTIAL/PLATELET
BASOS PCT: 0 %
Basophils Absolute: 0 cells/uL (ref 0–200)
EOS ABS: 57 {cells}/uL (ref 15–500)
Eosinophils Relative: 1 %
HEMATOCRIT: 38.2 % (ref 35.0–45.0)
HEMOGLOBIN: 13.1 g/dL (ref 11.7–15.5)
LYMPHS ABS: 855 {cells}/uL (ref 850–3900)
LYMPHS PCT: 15 %
MCH: 33.5 pg — ABNORMAL HIGH (ref 27.0–33.0)
MCHC: 34.3 g/dL (ref 32.0–36.0)
MCV: 97.7 fL (ref 80.0–100.0)
MONO ABS: 342 {cells}/uL (ref 200–950)
MPV: 10.5 fL (ref 7.5–12.5)
Monocytes Relative: 6 %
NEUTROS PCT: 78 %
Neutro Abs: 4446 cells/uL (ref 1500–7800)
Platelets: 216 10*3/uL (ref 140–400)
RBC: 3.91 MIL/uL (ref 3.80–5.10)
RDW: 12.7 % (ref 11.0–15.0)
WBC: 5.7 10*3/uL (ref 4.0–10.5)

## 2015-11-11 LAB — LIPID PANEL
CHOL/HDL RATIO: 4.4 ratio (ref <=5.0)
Cholesterol: 223 mg/dL — ABNORMAL HIGH (ref 125–200)
HDL: 51 mg/dL (ref >=46)
LDL CALC: 148 mg/dL — AB (ref <130)
TRIGLYCERIDES: 119 mg/dL (ref <150)
VLDL: 24 mg/dL (ref <30)

## 2015-11-11 LAB — POCT URINALYSIS DIPSTICK
BILIRUBIN UA: NEGATIVE
Blood, UA: NEGATIVE
GLUCOSE UA: NEGATIVE
KETONES UA: NEGATIVE
Nitrite, UA: NEGATIVE
Protein, UA: NEGATIVE
Spec Grav, UA: 1.02
Urobilinogen, UA: NEGATIVE
pH, UA: 7.5

## 2015-11-11 MED ORDER — CYANOCOBALAMIN 1000 MCG/ML IJ SOLN
1000.0000 ug | Freq: Once | INTRAMUSCULAR | Status: AC
  Administered 2015-11-11: 1000 ug via INTRAMUSCULAR

## 2015-11-11 NOTE — Patient Instructions (Signed)
HEALTH MAINTENANCE RECOMMENDATIONS:  It is recommended that you get at least 30 minutes of aerobic exercise at least 5 days/week (for weight loss, you may need as much as 60-90 minutes). This can be any activity that gets your heart rate up. This can be divided in 10-15 minute intervals if needed, but try and build up your endurance at least once a week.  Weight bearing exercise is also recommended twice weekly.  Eat a healthy diet with lots of vegetables, fruits and fiber.  "Colorful" foods have a lot of vitamins (ie green vegetables, tomatoes, red peppers, etc).  Limit sweet tea, regular sodas and alcoholic beverages, all of which has a lot of calories and sugar.  Up to 1 alcoholic drink daily may be beneficial for women (unless trying to lose weight, watch sugars).  Drink a lot of water.  Calcium recommendations are 1200-1500 mg daily (1500 mg for postmenopausal women or women without ovaries), and vitamin D 1000 IU daily.  This should be obtained from diet and/or supplements (vitamins), and calcium should not be taken all at once, but in divided doses.  Monthly self breast exams and yearly mammograms for women over the age of 54 is recommended.  Sunscreen of at least SPF 30 should be used on all sun-exposed parts of the skin when outside between the hours of 10 am and 4 pm (not just when at beach or pool, but even with exercise, golf, tennis, and yard work!)  Use a sunscreen that says "broad spectrum" so it covers both UVA and UVB rays, and make sure to reapply every 1-2 hours.  Remember to change the batteries in your smoke detectors when changing your clock times in the spring and fall.  Use your seat belt every time you are in a car, and please drive safely and not be distracted with cell phones and texting while driving.  Please try and quit smoking--start thinking about why/when you smoke (habit, boredom, stress) in order to come up with effective strategies to cut back or quit. Available  resources to help you quit include free counseling through [facility]uitline ([facility]Quitline.com or 1-800-QUITNOW), smoking cessation classes through [facility] (call to find out schedule), over-the-counter nicotine replacements, and e-cigarettes (although this may not help break the hand-mouth habit).  Many insurance companies also have smoking cessation programs (which may decrease the cost of patches, meds if enrolled).  If these methods are not effective for you, and you are motivated to quit, return to discuss the possibility of prescription medications.   Ganglion Cyst A ganglion cyst is a noncancerous, fluid-filled lump that occurs near joints or tendons. The ganglion cyst grows out of a joint or the lining of a tendon. It most often develops in the hand or wrist, but it can also develop in the shoulder, elbow, hip, knee, ankle, or foot. The round or oval ganglion cyst can be the size of a pea or larger than a grape. Increased activity may enlarge the size of the cyst because more fluid starts to build up.  CAUSES It is not known what causes a ganglion cyst to grow. However, it may be related to:  Inflammation or irritation around the joint.  An injury.  Repetitive movements or overuse.  Arthritis. RISK FACTORS Risk factors include:  Being a woman.  Being age 54-50. SIGNS AND SYMPTOMS Symptoms may include:   A lump. This most often appears on the hand or wrist, but it can occur in other areas of  the body.  Tingling.  Pain.  Numbness.  Muscle weakness.  Weak grip.  Less movement in a joint. DIAGNOSIS Ganglion cysts are most often diagnosed based on a physical exam. Your health care provider will feel the lump and may shine a light alongside it. If it is a ganglion cyst, a light often shines through it. Your health care provider may order an X-ray, ultrasound, or MRI to rule out other conditions. TREATMENT Ganglion cysts usually go away on their own without  treatment. If pain or other symptoms are involved, treatment may be needed. Treatment is also needed if the ganglion cyst limits your movement or if it gets infected. Treatment may include:  Wearing a brace or splint on your wrist or finger.  Taking anti-inflammatory medicine.  Draining fluid from the lump with a needle (aspiration).  Injecting a steroid into the joint.  Surgery to remove the ganglion cyst. HOME CARE INSTRUCTIONS  Do not press on the ganglion cyst, poke it with a needle, or hit it.  Take medicines only as directed by your health care provider.  Wear your brace or splint as directed by your health care provider.  Watch your ganglion cyst for any changes.  Keep all follow-up visits as directed by your health care provider. This is important. SEEK MEDICAL CARE IF:  Your ganglion cyst becomes larger or more painful.  You have increased redness, red streaks, or swelling.  You have pus coming from the lump.  You have weakness or numbness in the affected area.  You have a fever or chills.   This information is not intended to replace advice given to you by your health care provider. Make sure you discuss any questions you have with your health care provider.   Document Released: 02/12/2000 Document Revised: 03/07/2014 Document Reviewed: 07/30/2013 Elsevier Interactive Patient Education [employer].

## 2015-11-12 LAB — HEPATITIS C ANTIBODY: HCV Ab: NEGATIVE

## 2015-11-12 LAB — URINE CULTURE: Organism ID, Bacteria: NO GROWTH

## 2015-11-12 LAB — VITAMIN D 25 HYDROXY (VIT D DEFICIENCY, FRACTURES): Vit D, 25-Hydroxy: 34 ng/mL (ref 30–100)

## 2015-11-12 LAB — TSH: TSH: 2.51 m[IU]/L

## 2015-11-12 LAB — VITAMIN B12: VITAMIN B 12: 723 pg/mL (ref 200–1100)

## 2015-12-09 ENCOUNTER — Other Ambulatory Visit (INDEPENDENT_AMBULATORY_CARE_PROVIDER_SITE_OTHER): Payer: BLUE CROSS/BLUE SHIELD

## 2015-12-09 DIAGNOSIS — D510 Vitamin B12 deficiency anemia due to intrinsic factor deficiency: Secondary | ICD-10-CM

## 2015-12-09 MED ORDER — CYANOCOBALAMIN 1000 MCG/ML IJ SOLN
1000.0000 ug | Freq: Once | INTRAMUSCULAR | Status: AC
  Administered 2015-12-09: 1000 ug via INTRAMUSCULAR

## 2016-01-13 ENCOUNTER — Other Ambulatory Visit (INDEPENDENT_AMBULATORY_CARE_PROVIDER_SITE_OTHER): Payer: BLUE CROSS/BLUE SHIELD

## 2016-01-13 DIAGNOSIS — D509 Iron deficiency anemia, unspecified: Secondary | ICD-10-CM

## 2016-01-13 DIAGNOSIS — D649 Anemia, unspecified: Secondary | ICD-10-CM

## 2016-01-13 MED ORDER — CYANOCOBALAMIN 1000 MCG/ML IJ SOLN
1000.0000 ug | Freq: Once | INTRAMUSCULAR | Status: AC
  Administered 2016-01-13: 1000 ug via INTRAMUSCULAR

## 2016-01-18 DIAGNOSIS — Z1151 Encounter for screening for human papillomavirus (HPV): Secondary | ICD-10-CM | POA: Diagnosis not present

## 2016-01-18 DIAGNOSIS — Z01419 Encounter for gynecological examination (general) (routine) without abnormal findings: Secondary | ICD-10-CM | POA: Diagnosis not present

## 2016-01-18 DIAGNOSIS — Z681 Body mass index (BMI) 19 or less, adult: Secondary | ICD-10-CM | POA: Diagnosis not present

## 2016-01-28 ENCOUNTER — Encounter: Payer: Self-pay | Admitting: Family Medicine

## 2016-01-28 ENCOUNTER — Ambulatory Visit (INDEPENDENT_AMBULATORY_CARE_PROVIDER_SITE_OTHER): Payer: BLUE CROSS/BLUE SHIELD | Admitting: Family Medicine

## 2016-01-28 VITALS — BP 100/60 | HR 64 | Ht 67.0 in | Wt 122.2 lb

## 2016-01-28 DIAGNOSIS — D510 Vitamin B12 deficiency anemia due to intrinsic factor deficiency: Secondary | ICD-10-CM

## 2016-01-28 DIAGNOSIS — G250 Essential tremor: Secondary | ICD-10-CM

## 2016-01-28 NOTE — Patient Instructions (Signed)
I see no abnormalities in your neurologic exam.  I see no resting tremor and could not reproduce your symptoms. I suspect it is related to the position that your head is in, causing some muscle strain to the muscles in the back of the neck--they might get fatigued, and that might contribute. Try and keep a head neutral position--keeping your book, phone, etc at eye level rather than looking down for prolonged periods of time. If you are having headaches and discomfort in the back of the neck, try moist heat, stretches, as shown.  If you have any new neurologic symptoms--worsening of numbness, new areas, any weakness, trouble with memory, thinking, speech, balance, etc, please return to re-address.

## 2016-01-28 NOTE — Progress Notes (Signed)
Chief Complaint  Patient presents with  . Advice Only    6 months ago her husbnad noticed that when she looks down her head shakes slightly(she is unaware of this when it is happening). More recently, over the Thanksgiving holiday her friend was here and she also noticed this happening twice, again patient is unaware. She would like to make sure nothing neurological is going on.    Patient presents to discuss possible head tremor/shaking that has been noticed by her husband and her friend when she is looking down.  It seems to only be noticed (by others, she isn't aware of this), when looking down--on phone, or concentrating on something like cooking in the kitchen or doing crafts. Denies any hand tremor, family history of tremor or other movement disorders (distant relative with Parkinson's--Great uncle (mother's mother's brother))  First noted 6 months ago by her husband, who thought it was likely muscular, wasn't consistent, sporadic.  Her friend was recently visiting, and Izora Gala noticed it three times, "very very slight".  Patient has known B12 deficiency, with normal B12 levels recently. Lab Results  Component Value Date   [id number] 11/11/2015  She has some ongoing tingling in her fingers, worse in colder weather. Denies raynaud's or color changes. No numbness tingling in toes or elsewhere in body. Occasional dizziness due to "low iron"--unchanged, nothing new. A few headaches in the last couple of weeks--sinus-related. Some posterior headaches, into the neck. No problems with balance, equilibrium, word-finding, memory or other neurologic concerns. Doesn't drink caffeine.  PMH, PSH, SH and FH were reviewed (still smokes)  Outpatient Encounter Prescriptions as of 01/28/2016  Medication Sig Note  . ALPRAZolam (XANAX) 0.5 MG tablet Take 0.5-1 tablets (0.25-0.5 mg total) by mouth 3 (three) times daily as needed for sleep or anxiety.   . cetirizine (ZYRTEC) 10 MG tablet Take 10 mg by  mouth daily.     . Famotidine (PEPCID PO) Take by mouth. 11/11/2015: Using prn, about 1-2x/week  . Multiple Vitamin (MULITIVITAMIN WITH MINERALS) TABS Take 1 tablet by mouth daily.   . Multiple Vitamins-Minerals (HAIR SKIN AND NAILS FORMULA PO) Take 2 each by mouth daily.   . Cholecalciferol (VITAMIN D) 2000 UNITS tablet Take 2,000 Units by mouth daily.   Marland Kitchen ibuprofen (ADVIL,MOTRIN) 200 MG tablet Take 200-600 mg by mouth every 6 (six) hours as needed. For headache or pain   . vitamin C (ASCORBIC ACID) 500 MG tablet Take 500 mg by mouth daily.   . [DISCONTINUED] Phenylephrine-APAP-Guaifenesin (MUCINEX FAST-MAX COLD & SINUS PO) Take 20 mLs by mouth 2 (two) times daily.    No facility-administered encounter medications on file as of 01/28/2016.    No Known Allergies  ROS: no fever, chills, URI symptoms. Some sinus headaches and posterior neck pain as per HPI.  +hand numbness intermittently per HPI.  No chest pain, shortness of breath, GI or GU complaints, bleeding, bruising, rash or other complaints, except as noted in HPI.  PHYSICAL EXAM:  BP 100/60 (BP Location: Left Arm, Patient Position: Sitting, Cuff Size: Normal)   Pulse 64   Ht 5\' 7"  (1.702 m)   Wt 122 lb 3.2 oz (55.4 kg)   LMP 10/05/2011   BMI 19.14 kg/m   Thin, well appearing female in no distress HEENT: PERRL, EOMI, conjunctiva and sclera are clear Neck: no lymphadenopathy or mass. No spinal tenderness. Some tenderness over paraspinous muscles, slight discomfort with stretching Neuro: alert and oriented, cranial nerves intact. Normal strength, sensation; DTR's 2+ and symmetric.  Normal finger to nose. No pronator drift, negative Romberg. Normal tandem gait.  No head or hand tremors noted. Could not reproduce any head shaking with keeping her neck flexed. Psych: normal mood, affect, hygiene and grooming   ASSESSMENT/PLAN:  Benign head tremor - Not evident today. no e/o head bob or hand tremor. Suspect related to strain  (muscular)/posture--reassured  Pernicious anemia - adequately replaced on current regimen   Discussed potential neuro symptoms that if they develop, she should return for re-eval.  No symptoms to suggest hyperthyroidism. Lab Results  Component Value Date   TSH 2.51 11/11/2015   I suspect that the very mild tremor is related to neck strain/positional/muscular. Discussed keeping head neutral, discussed and shown stretches. Discussed heat, massage.  F/u if any neuro symptoms develop, tremor persists, or other concerns develop.

## 2016-02-17 ENCOUNTER — Other Ambulatory Visit (INDEPENDENT_AMBULATORY_CARE_PROVIDER_SITE_OTHER): Payer: BLUE CROSS/BLUE SHIELD

## 2016-02-17 DIAGNOSIS — D510 Vitamin B12 deficiency anemia due to intrinsic factor deficiency: Secondary | ICD-10-CM

## 2016-02-17 MED ORDER — CYANOCOBALAMIN 1000 MCG/ML IJ SOLN
1000.0000 ug | Freq: Once | INTRAMUSCULAR | Status: AC
  Administered 2016-02-17: 1000 ug via INTRAMUSCULAR

## 2016-03-23 ENCOUNTER — Other Ambulatory Visit (INDEPENDENT_AMBULATORY_CARE_PROVIDER_SITE_OTHER): Payer: BLUE CROSS/BLUE SHIELD

## 2016-03-23 DIAGNOSIS — D510 Vitamin B12 deficiency anemia due to intrinsic factor deficiency: Secondary | ICD-10-CM

## 2016-03-23 MED ORDER — CYANOCOBALAMIN 1000 MCG/ML IJ SOLN
1000.0000 ug | Freq: Once | INTRAMUSCULAR | Status: AC
  Administered 2016-03-23: 1000 ug via INTRAMUSCULAR

## 2016-04-27 ENCOUNTER — Other Ambulatory Visit (INDEPENDENT_AMBULATORY_CARE_PROVIDER_SITE_OTHER): Payer: BLUE CROSS/BLUE SHIELD

## 2016-04-27 DIAGNOSIS — D510 Vitamin B12 deficiency anemia due to intrinsic factor deficiency: Secondary | ICD-10-CM

## 2016-04-27 MED ORDER — CYANOCOBALAMIN 1000 MCG/ML IJ SOLN
1000.0000 ug | Freq: Once | INTRAMUSCULAR | Status: AC
  Administered 2016-04-27: 1000 ug via INTRAMUSCULAR

## 2016-05-26 ENCOUNTER — Other Ambulatory Visit (INDEPENDENT_AMBULATORY_CARE_PROVIDER_SITE_OTHER): Payer: BLUE CROSS/BLUE SHIELD

## 2016-05-26 DIAGNOSIS — D649 Anemia, unspecified: Secondary | ICD-10-CM | POA: Diagnosis not present

## 2016-05-26 MED ORDER — CYANOCOBALAMIN 1000 MCG/ML IJ SOLN
1000.0000 ug | Freq: Once | INTRAMUSCULAR | Status: AC
  Administered 2016-05-26: 1000 ug via INTRAMUSCULAR

## 2016-06-30 ENCOUNTER — Other Ambulatory Visit (INDEPENDENT_AMBULATORY_CARE_PROVIDER_SITE_OTHER): Payer: BLUE CROSS/BLUE SHIELD

## 2016-06-30 DIAGNOSIS — D510 Vitamin B12 deficiency anemia due to intrinsic factor deficiency: Secondary | ICD-10-CM

## 2016-06-30 MED ORDER — CYANOCOBALAMIN 1000 MCG/ML IJ SOLN
1000.0000 ug | Freq: Once | INTRAMUSCULAR | Status: AC
  Administered 2016-06-30: 1000 ug via INTRAMUSCULAR

## 2016-07-27 ENCOUNTER — Other Ambulatory Visit: Payer: Self-pay | Admitting: Obstetrics and Gynecology

## 2016-07-27 DIAGNOSIS — Z1231 Encounter for screening mammogram for malignant neoplasm of breast: Secondary | ICD-10-CM

## 2016-08-01 ENCOUNTER — Other Ambulatory Visit (INDEPENDENT_AMBULATORY_CARE_PROVIDER_SITE_OTHER): Payer: BLUE CROSS/BLUE SHIELD

## 2016-08-01 DIAGNOSIS — D510 Vitamin B12 deficiency anemia due to intrinsic factor deficiency: Secondary | ICD-10-CM

## 2016-08-01 MED ORDER — CYANOCOBALAMIN 1000 MCG/ML IJ SOLN
1000.0000 ug | Freq: Once | INTRAMUSCULAR | Status: AC
  Administered 2016-08-01: 1000 ug via INTRAMUSCULAR

## 2016-08-25 ENCOUNTER — Other Ambulatory Visit (INDEPENDENT_AMBULATORY_CARE_PROVIDER_SITE_OTHER): Payer: BLUE CROSS/BLUE SHIELD

## 2016-08-25 DIAGNOSIS — D510 Vitamin B12 deficiency anemia due to intrinsic factor deficiency: Secondary | ICD-10-CM | POA: Diagnosis not present

## 2016-08-25 MED ORDER — CYANOCOBALAMIN 1000 MCG/ML IJ SOLN
1000.0000 ug | Freq: Once | INTRAMUSCULAR | Status: AC
  Administered 2016-08-25: 1000 ug via INTRAMUSCULAR

## 2016-09-22 ENCOUNTER — Other Ambulatory Visit (INDEPENDENT_AMBULATORY_CARE_PROVIDER_SITE_OTHER): Payer: BLUE CROSS/BLUE SHIELD

## 2016-09-22 ENCOUNTER — Telehealth: Payer: Self-pay

## 2016-09-22 ENCOUNTER — Ambulatory Visit
Admission: RE | Admit: 2016-09-22 | Discharge: 2016-09-22 | Disposition: A | Discharge status: Home / Self Care | Payer: BLUE CROSS/BLUE SHIELD | Source: Ambulatory Visit | Attending: Obstetrics and Gynecology | Admitting: Obstetrics and Gynecology

## 2016-09-22 DIAGNOSIS — Z1231 Encounter for screening mammogram for malignant neoplasm of breast: Secondary | ICD-10-CM | POA: Diagnosis not present

## 2016-09-22 DIAGNOSIS — E538 Deficiency of other specified B group vitamins: Secondary | ICD-10-CM

## 2016-09-22 MED ORDER — CYANOCOBALAMIN 1000 MCG/ML IJ SOLN
1000.0000 ug | Freq: Once | INTRAMUSCULAR | Status: AC
  Administered 2016-09-22: 1000 ug via INTRAMUSCULAR

## 2016-09-22 NOTE — Telephone Encounter (Signed)
Okay to wait and give when she sees me (and check labs then). However, if she really feels bad (fatigue, numbness/tingling, etc), she can change her mind and return for injection prior to her visit with me.

## 2016-09-22 NOTE — Telephone Encounter (Signed)
PT was in today for Vit B12, she states she is seeing you Sept 13th (7 weeks away)  But inquired if she should schedule another B12 prior to that since she gets them every 5 weeks, or wait and check with labs?  [phone]. Thanks

## 2016-09-23 NOTE — Telephone Encounter (Signed)
Pt aware. /RLB  

## 2016-09-26 DIAGNOSIS — H524 Presbyopia: Secondary | ICD-10-CM | POA: Diagnosis not present

## 2016-09-28 ENCOUNTER — Ambulatory Visit (INDEPENDENT_AMBULATORY_CARE_PROVIDER_SITE_OTHER): Payer: BLUE CROSS/BLUE SHIELD | Admitting: Family Medicine

## 2016-09-28 ENCOUNTER — Encounter: Payer: Self-pay | Admitting: Family Medicine

## 2016-09-28 VITALS — BP 134/80 | HR 80 | Temp 98.1°F | Ht 67.0 in | Wt 122.4 lb

## 2016-09-28 DIAGNOSIS — D510 Vitamin B12 deficiency anemia due to intrinsic factor deficiency: Secondary | ICD-10-CM

## 2016-09-28 DIAGNOSIS — R251 Tremor, unspecified: Secondary | ICD-10-CM

## 2016-09-28 DIAGNOSIS — R51 Headache: Secondary | ICD-10-CM | POA: Diagnosis not present

## 2016-09-28 DIAGNOSIS — R002 Palpitations: Secondary | ICD-10-CM

## 2016-09-28 DIAGNOSIS — R519 Headache, unspecified: Secondary | ICD-10-CM

## 2016-09-28 DIAGNOSIS — R42 Dizziness and giddiness: Secondary | ICD-10-CM | POA: Diagnosis not present

## 2016-09-28 LAB — CBC WITH DIFFERENTIAL/PLATELET
BASOS ABS: 0 {cells}/uL (ref 0–200)
BASOS PCT: 0 %
EOS ABS: 45 {cells}/uL (ref 15–500)
Eosinophils Relative: 1 %
HCT: 39 % (ref 35.0–45.0)
Hemoglobin: 13.4 g/dL (ref 11.7–15.5)
LYMPHS PCT: 25 %
Lymphs Abs: 1125 cells/uL (ref 850–3900)
MCH: 34.1 pg — AB (ref 27.0–33.0)
MCHC: 34.4 g/dL (ref 32.0–36.0)
MCV: 99.2 fL (ref 80.0–100.0)
MONO ABS: 360 {cells}/uL (ref 200–950)
MONOS PCT: 8 %
MPV: 10.6 fL (ref 7.5–12.5)
Neutro Abs: 2970 cells/uL (ref 1500–7800)
Neutrophils Relative %: 66 %
PLATELETS: 257 10*3/uL (ref 140–400)
RBC: 3.93 MIL/uL (ref 3.80–5.10)
RDW: 12.6 % (ref 11.0–15.0)
WBC: 4.5 10*3/uL (ref 4.0–10.5)

## 2016-09-28 LAB — TSH: TSH: 1.85 mIU/L

## 2016-09-28 NOTE — Progress Notes (Signed)
Chief Complaint  Patient presents with  . Advice Only    having some issues with what she thinks may be blood sugar issues. Says she has a history of anxiety but this is different. Has a headache on top of her head after these little episodes. Has shakiness from within and her eyes get kind of weird. Gets foggy feeling.    2 days ago she "felt weird when she woke up".  She stayed up late, got up early for eye appt.  Only had 6 hours sleep (usually gets 8). She was rushing, had water, hadn't eaten anything (grabbed a banana and left it in the car). Felt fine while waiting.  She had trouble seeing eyechart, one eye worse than the other.  She has known dry eye. She then had her eyes dilated.  During exam, while chin in machine and bright light in eye--chin started shaking.  Felt nauseated. She sat back, ate some crackers.  Second attempt was a little better, but still shaky.  Had candy, and third attempt she was fine. BP was 120/80 and pulse 85 during this episode at the eye doctor.  Husband had a hypoglycemic seizure on Father's Day  She got very shaky, felt helpless while watching him.   This shaking felt similar--uncontrollable shake, from inside. Seemed to respond to eating.Sugar was 82 when husband got home.  99 the next morning. She felt fine yesterday. Today she woke up feeling weird again.   Had a hard time reading something smalle earlier today, started feeling shaky again, heart was racing. Feels like she is in a fog. Has a headache on the top of her head and in posterior neck after both episodes. Currently has slight headache at top of head, but the nausea, shakiness has resolved.  She has h/o anxiety--feels a little different.  Felt "more physical".  +life stress--son had emergency appendectomy in [place] (this past Friday).  Worried about her husband's health (had seizure related to hypoglycemia).    She had some vertigo--5-6 weeks ago, prior to her last B12 shot last month--with  change in position in bed and laying flat.  Meclizine helped.  Still has some symptoms when laying flat and sits up quickly.  Not sleeping well recently, bu thas always been a good sleeper. Getting <8 hours, which is unusual for her, over the last couple of months (since Father's Day). Hasn't been doing yoga since Father's Day. Hasn't done training since she had vertigo during [employer].  Denies fever, chills.  "normal seasonal allergies"--runny nose, blowing nose, clear mucus.  +itchy eyes.  +dry eyes.  Hasn't had the head shaking while looking at her phone since she paid more attention to her posture.  Has some posterior neck discomfort, straight up to the top of her head, and a little to her temples.  Last B12 shot was last week, 7/26.  PMH, PSH, SH reviewed  Outpatient Encounter Prescriptions as of 09/28/2016  Medication Sig Note  . ibuprofen (ADVIL,MOTRIN) 200 MG tablet Take 200-600 mg by mouth every 6 (six) hours as needed. For headache or pain 09/28/2016: Last dose today at 12:30pm  . Multiple Vitamin (MULITIVITAMIN WITH MINERALS) TABS Take 1 tablet by mouth daily.   . Multiple Vitamins-Minerals (HAIR SKIN AND NAILS FORMULA PO) Take 2 each by mouth daily.   . vitamin C (ASCORBIC ACID) 500 MG tablet Take 500 mg by mouth daily.   Marland Kitchen ALPRAZolam (XANAX) 0.5 MG tablet Take 0.5-1 tablets (0.25-0.5 mg total) by mouth 3 (  three) times daily as needed for sleep or anxiety. (Patient not taking: Reported on 09/28/2016)   . cetirizine (ZYRTEC) 10 MG tablet Take 10 mg by mouth daily.     . Cholecalciferol (VITAMIN D) 2000 UNITS tablet Take 2,000 Units by mouth daily.   . Famotidine (PEPCID PO) Take by mouth. 11/11/2015: Using prn, about 1-2x/week   No facility-administered encounter medications on file as of 09/28/2016.    No Known Allergies  ROS:  See HPI.  No fever, chills.  +allergies, dry eyes.  No URI symptoms. No chest pain, shortness of breath.  +palpitations during shaky episodes, along  with nausea,  No vomiting, diarrhea, bleeding, bruising, rash, numbness, tingling.  +mild headaches and posterior neck pain per HPI.  +anxiety, no depression. Intermittent positional vertigo, per HPI.  No other concerns, except as noted in HPI.  PHYSICAL EXAM:  BP 134/80 (BP Location: Left Arm, Patient Position: Sitting, Cuff Size: Normal)   Pulse 80   Temp 98.1 F (36.7 C) (Tympanic)   Ht '5\' 7"'  (1.702 m)   Wt 122 lb 6.4 oz (55.5 kg)   LMP 10/10/2011   BMI 19.17 kg/m     Wt Readings from Last 3 Encounters:  09/28/16 122 lb 6.4 oz (55.5 kg)  01/28/16 122 lb 3.2 oz (55.4 kg)  11/11/15 121 lb 3.2 oz (55 kg)    Well developed, pleasant female in no distress HEENT: PERRL, EOMI, conjunctiva and sclera are clear.  Fundi benign. TM's and EAC's normal.  OP clear.  No sinus tenderness. OP clear Neck: no lymphadenopathy, thyromegaly or mass, no carotid bruit Heart: regular rate and rhythm Lungs: clear bilaterally Back: no CVA or spinal tenderness Abdomen: soft, nontender, no mass Extremities: no edema, 2+ pulses Neuro: alert and oriented, cranial nerves intact.  Normal strength, sensation. DTR's 2+ and symmetric.  No pronator drift,  Normal finger to nose. No nystagmus. Psych: mildly anxious. Full range of affect, normal speech, eye contact, hygiene and grooming. Skin: normal turgor, no rash  ASSESSMENT/PLAN:  Shakiness - suspect related to anxiety (poss component of hypoglycemia). Discussed adequate sleep, nutrition, stress reduction  Palpitations - occuring during episodes of shakiness--suspect related to anxiety, rather than SVT, as pulse was not tachycardic at eye doctors - Plan: CBC with Differential/Platelet, TSH  Dizziness - intermittent positional vertigo.  meclizine prn - Plan: CBC with Differential/Platelet, TSH, Comprehensive metabolic panel  Nonintractable headache, unspecified chronicity pattern, unspecified headache type - posterior/tension headache. stress reduction,  NSAID prn, heat/stretches  Pernicious anemia - getting monthly B12 shots, last a week ago - Plan: CBC with Differential/Platelet, Vitamin B12   Check fasting Lipids at CPE Rest of labs now CBC, c-met, TSH, B12  25-30 min visit, more than 1/2 spent counseling. Reassured re: normal neuro exam.   Assuming that nothing is abnormal on the bloodwork, and that you continue to eat and drink regularly. I have to think that much your symptoms are related to stress/anxiety.  Your sleep pattern has been different.  You haven't been doing your yoga. Work on getting back to yoga (avoiding aerial if you are having vertigo) and other stress-reducing techniques, meditation. Let us know if you continue to feel badly (you don't need to wait until your next visit in September--contact us by phone or MyChart).

## 2016-09-28 NOTE — Patient Instructions (Signed)
  Assuming that nothing is abnormal on the bloodwork, and that you continue to eat and drink regularly. I have to think that much your symptoms are related to stress/anxiety.  Your sleep pattern has been different.  You haven't been doing your yoga. Work on getting back to yoga (avoiding aerial if you are having vertigo) and other stress-reducing techniques, meditation. Let us know if you continue to feel badly (you don't need to wait until your next visit in September--contact us by phone or MyChart).  Come fasting in September so we can recheck your cholesterol then.

## 2016-09-29 LAB — COMPREHENSIVE METABOLIC PANEL
ALT: 15 U/L (ref 6–29)
AST: 19 U/L (ref 10–35)
Albumin: 4.2 g/dL (ref 3.6–5.1)
Alkaline Phosphatase: 58 U/L (ref 33–130)
BUN: 11 mg/dL (ref 7–25)
CALCIUM: 9.4 mg/dL (ref 8.6–10.4)
CHLORIDE: 104 mmol/L (ref 98–110)
CO2: 24 mmol/L (ref 20–31)
Creat: 0.69 mg/dL (ref 0.50–1.05)
GLUCOSE: 89 mg/dL (ref 65–99)
Potassium: 4.5 mmol/L (ref 3.5–5.3)
SODIUM: 139 mmol/L (ref 135–146)
Total Bilirubin: 0.3 mg/dL (ref 0.2–1.2)
Total Protein: 7.4 g/dL (ref 6.1–8.1)

## 2016-09-29 LAB — VITAMIN B12: Vitamin B-12: 830 pg/mL (ref 200–1100)

## 2016-09-30 DIAGNOSIS — L821 Other seborrheic keratosis: Secondary | ICD-10-CM | POA: Diagnosis not present

## 2016-09-30 DIAGNOSIS — D2262 Melanocytic nevi of left upper limb, including shoulder: Secondary | ICD-10-CM | POA: Diagnosis not present

## 2016-09-30 DIAGNOSIS — D2230 Melanocytic nevi of unspecified part of face: Secondary | ICD-10-CM | POA: Diagnosis not present

## 2016-09-30 DIAGNOSIS — Z86018 Personal history of other benign neoplasm: Secondary | ICD-10-CM | POA: Diagnosis not present

## 2016-11-14 ENCOUNTER — Ambulatory Visit (INDEPENDENT_AMBULATORY_CARE_PROVIDER_SITE_OTHER): Payer: BLUE CROSS/BLUE SHIELD | Admitting: Family Medicine

## 2016-11-14 ENCOUNTER — Encounter: Payer: Self-pay | Admitting: Family Medicine

## 2016-11-14 VITALS — BP 108/70 | HR 64 | Temp 97.3°F | Ht 67.0 in | Wt 122.0 lb

## 2016-11-14 DIAGNOSIS — Z0000 Encounter for general adult medical examination without abnormal findings: Secondary | ICD-10-CM

## 2016-11-14 DIAGNOSIS — F419 Anxiety disorder, unspecified: Secondary | ICD-10-CM | POA: Diagnosis not present

## 2016-11-14 DIAGNOSIS — M85859 Other specified disorders of bone density and structure, unspecified thigh: Secondary | ICD-10-CM | POA: Diagnosis not present

## 2016-11-14 DIAGNOSIS — E7800 Pure hypercholesterolemia, unspecified: Secondary | ICD-10-CM | POA: Diagnosis not present

## 2016-11-14 DIAGNOSIS — E559 Vitamin D deficiency, unspecified: Secondary | ICD-10-CM | POA: Diagnosis not present

## 2016-11-14 DIAGNOSIS — K219 Gastro-esophageal reflux disease without esophagitis: Secondary | ICD-10-CM | POA: Diagnosis not present

## 2016-11-14 DIAGNOSIS — Z23 Encounter for immunization: Secondary | ICD-10-CM

## 2016-11-14 DIAGNOSIS — F17200 Nicotine dependence, unspecified, uncomplicated: Secondary | ICD-10-CM | POA: Diagnosis not present

## 2016-11-14 DIAGNOSIS — D510 Vitamin B12 deficiency anemia due to intrinsic factor deficiency: Secondary | ICD-10-CM

## 2016-11-14 LAB — POCT URINALYSIS DIP (PROADVANTAGE DEVICE)
BILIRUBIN UA: NEGATIVE
Glucose, UA: NEGATIVE mg/dL
Ketones, POC UA: NEGATIVE mg/dL
LEUKOCYTES UA: NEGATIVE
NITRITE UA: NEGATIVE
PH UA: 7.5 (ref 5.0–8.0)
PROTEIN UA: NEGATIVE mg/dL
RBC UA: NEGATIVE
Specific Gravity, Urine: 1.015
Urobilinogen, Ur: NEGATIVE

## 2016-11-14 MED ORDER — CYANOCOBALAMIN 1000 MCG/ML IJ SOLN
1000.0000 ug | Freq: Once | INTRAMUSCULAR | Status: AC
  Administered 2016-11-14: 1000 ug via INTRAMUSCULAR

## 2016-11-14 MED ORDER — BUPROPION HCL ER (XL) 150 MG PO TB24: 150.0000 mg | ORAL_TABLET | Freq: Every day | ORAL | 0 refills | Status: DC

## 2016-11-14 MED ORDER — BUPROPION HCL ER (XL) 300 MG PO TB24: 300.0000 mg | ORAL_TABLET | Freq: Every day | ORAL | 2 refills | Status: DC

## 2016-11-14 NOTE — Patient Instructions (Addendum)
  HEALTH MAINTENANCE RECOMMENDATIONS:  It is recommended that you get at least 30 minutes of aerobic exercise at least 5 days/week (for weight loss, you may need as much as 60-90 minutes). This can be any activity that gets your heart rate up. This can be divided in 10-15 minute intervals if needed, but try and build up your endurance at least once a week.  Weight bearing exercise is also recommended twice weekly.  Eat a healthy diet with lots of vegetables, fruits and fiber.  "Colorful" foods have a lot of vitamins (ie green vegetables, tomatoes, red peppers, etc).  Limit sweet tea, regular sodas and alcoholic beverages, all of which has a lot of calories and sugar.  Up to 1 alcoholic drink daily may be beneficial for women (unless trying to lose weight, watch sugars).  Drink a lot of water.  Calcium recommendations are 1200-1500 mg daily (1500 mg for postmenopausal women or women without ovaries), and vitamin D 1000 IU daily.  This should be obtained from diet and/or supplements (vitamins), and calcium should not be taken all at once, but in divided doses.  Monthly self breast exams and yearly mammograms for women over the age of 38 is recommended.  Sunscreen of at least SPF 30 should be used on all sun-exposed parts of the skin when outside between the hours of 10 am and 4 pm (not just when at beach or pool, but even with exercise, golf, tennis, and yard work!)  Use a sunscreen that says "broad spectrum" so it covers both UVA and UVB rays, and make sure to reapply every 1-2 hours.  Remember to change the batteries in your smoke detectors when changing your clock times in the spring and fall.  Use your seat belt every time you are in a car, and please drive safely and not be distracted with cell phones and texting while driving.   Restart vitamin D daily, I recommend 1000-2000 IU (but will let you know after test results are back). You likely don't need a multivitamin since taking  hair/skin/nails, as long as you get enough calcium from diet and/or Tums, and vitamin D separately.  Please quit smoking, and ensure that you get adequate calcium in your diet to help protect your bones.  Start the Wellbutrin XL 150mg  once daily in the morning.  After a week, increase to 2 tablets taken together.  When you run out, fill the next dose up (300mg ) which will be on file at the pharmacy for you. If for some reason you need to stay at 150mg , you will need to contact us for refills. Goal is to take this for 3 months for smoking cessation.  If you get benefit for other reasons (ie moods, etc), we can perhaps continue it for longer.  Set a quit date for at least 2 weeks after starting the medication and reach out to your insurance company or [place] or [email or web address] for help.  I recommend getting the new shingles vaccine (Shingrix). You will need to check with your insurance to see if it is covered, and if covered by Medicare Part D, you need to get from the pharmacy rather than our office.  It is a series of 2 injections, spaced 2 months apart.

## 2016-11-14 NOTE — Progress Notes (Signed)
Chief Complaint  Patient presents with  . Annual Exam    fasting annual exam, no pap-sees Dr.Taavon. UA showed 3+ leuks, no symptoms. No concerns. Needs B12 shot.   Lindsay Chang is a 56 y.o. female who presents for a complete physical.  She has the following concerns:  Seen about 6 weeks ago with complaint of shaking. Felt to possibly be related to anxiety.  Stressors included son having just had emergency appendectomy in [place] and worry about her husband's health (had seizure related to hypoglycemia).  CBC, C-met, B12 and TSH were normal. Since then she "chilled".  She is back to doing yoga more regularly, going to bed earlier, and is feeling much better.  Lump/bony prominence at the base of the right thumb (discussed at CPE last year) completely resolved.  B12 deficiency: She has been getting B12 injections every 4 weeks, and is doing well. Last shot was 7/26. She is now 7 weeks from her last shot and overall feels okay.  Denies numbness/tingling in hands/feet, other than fingertips getting numb when she gets cold (like in grocery store). Brother has Raynaud's, but she never sees color change when they feel numb.Denies memory problems or fatigue (minor).  Anxiety: Overall is doing much better.  Stressors include husband's health (diabetes with hypoglycemia causing a seizure in July). Plans to pull back on supporting her oldest child (who will be 46, living at home). Oldest son is no longer in college.  Interested in [place], but not in attending school right now. Other son graduated from [employer] isn't sure what he wants to do and is taking a year off.  Both are interested in organic farming. She continues to have anxiety related to flying ,for which she uses alprazolam prn.  GERD--overall doing well.  Has heartburn related to the foods she eats; uses pepcid about 1x/week, related to wine, or pizza. Denies dysphagia.  Tobacco dependence--she continues, along with her husband, to smoke. She  does want to quit.  She is afraid of Chantix as a friend of hers had bad dreams on it.  At one point she was good and kept cigarettes in her trunk, and had cut back a lot, but she lost her motivation.  When she and husband are working (during the week) she smokes 5-10/day, but more on the weekends when she is with her husband (1/2-3/4 PPD).  She thinks she needs to quit first before she can get him to. One habit/routine is relaxing on the porch with a cigarette when he comes home from work.  She realizes that they could walk the dog instead. She does find she smokes more when having wine.  Mild hyperlipidemia noted last year.  Cholesterol noted to be significantly higher than previously.  She was educated re: low cholesterol diet, and is due for repeat today. She thinks she has been better with her diet. Her sons are now vegan, so eating much less meat, more fish, less cheese, dairy (she used to snack on cheese a lot), now only has rare pizza and parmesan on pasta.  She hasn't been eating many eggs.  Lab Results  Component Value Date   CHOL 223 (H) 11/11/2015   HDL 51 11/11/2015   LDLCALC 148 (H) 11/11/2015   TRIG 119 11/11/2015   CHOLHDL 4.4 11/11/2015   Recent had recheck by fingerstick at her husband's job.  She questions the accuracy.  Just done 2-3 weeks ago.  She would like it repeated today. She recalls the results: Total chol  198, LDL 112, HDL 62  Osteopenia noted on 2017 DEXA from GYN.    Immunization History  Administered Date(s) Administered  . Influenza Split 11/30/2010, 12/15/2011  . Influenza,inj,Quad PF,6+ Mos 12/17/2012, 11/26/2013, 11/10/2014, 11/11/2015  . Pneumococcal Polysaccharide-23 11/11/2015  . Tdap 10/28/2008   Last Pap smear:  UTD, per Dr. Ronita Hipps Last mammogram: 08/2016 Last colonoscopy: 06/2012--Dr. Fuller Plan.  External hemorrhoids, sigmoid diverticulosis, hyperplastic polyp Last DEXA: 03/2015 T-1.9 at L fem neck Dentist: twice a year Ophtho: yearly; plans to find new  eye doctor Exercise: yoga 4-5 x/week; walks with dog 4-5 times/week usually (not in the last few months).  Vitamin D screen--level was 34 in 10/2015  Past Medical History:  Diagnosis Date  . Allergy   . Anxiety   . B12 deficiency   . Colon polyp, hyperplastic 06/2012  . Rosacea    Dr. Delman Cheadle    Past Surgical History:  Procedure Laterality Date  . COLONOSCOPY  06/2012   Dr. Fuller Plan  . rectal fissure  2002    Social History   Social History  . Marital status: Married    Spouse name: N/A  . Number of children: 2  . Years of education: N/A   Occupational History  . Yoga instructor    Social History Main Topics  . Smoking status: Current Every Day Smoker    Packs/day: 0.50    Types: Cigarettes  . Smokeless tobacco: Never Used     Comment: 1/2-3/4 PPD on weekends, only 5-10/d during week  . Alcohol use 1.8 - 2.4 oz/week    3 - 4 Glasses of wine per week     Comment: 3-4 glasses of wine/week (1 glass)  . Drug use: No  . Sexual activity: Yes    Birth control/ protection: Other-see comments     Comment: husband with vasectomy   Other Topics Concern  . Not on file   Social History Narrative   Verta Ellen daughter. 2 sons (both living at home, graduated from [employer], working).  Husband also smokes. Husband is type 1 diabetic   1 dog, 1 cat    Family History  Problem Relation Age of Onset  . Breast cancer Mother   . Graves' disease Mother   . Hyperlipidemia Mother   . Cancer Mother        breast and CLL (late 62's)  . Leukemia Mother   . Hypertension Father   . Anxiety disorder Father   . Thyroid disease Brother   . Raynaud syndrome Brother   . Colon cancer Maternal Grandmother 28  . Cancer Maternal Grandmother        colon, skin  . Aortic aneurysm Maternal Grandfather   . Lung cancer Maternal Grandfather   . Diabetes Neg Hx     Outpatient Encounter Prescriptions as of 11/14/2016  Medication Sig Note  . cetirizine (ZYRTEC) 10 MG tablet Take 10 mg by mouth  daily.   11/14/2016: Uses prn, took it last night  . Multiple Vitamins-Minerals (HAIR SKIN AND NAILS FORMULA PO) Take 2 each by mouth daily.   Marland Kitchen ALPRAZolam (XANAX) 0.5 MG tablet Take 0.5-1 tablets (0.25-0.5 mg total) by mouth 3 (three) times daily as needed for sleep or anxiety. (Patient not taking: Reported on 11/14/2016) 11/14/2016: Uses for anxiety with flying  . Cholecalciferol (VITAMIN D) 2000 UNITS tablet Take 2,000 Units by mouth daily. 11/14/2016: Hasn't been taking recently  . Famotidine (PEPCID PO) Take by mouth. 11/14/2016: Uses prn, about once a week or less  .  ibuprofen (ADVIL,MOTRIN) 200 MG tablet Take 200-600 mg by mouth every 6 (six) hours as needed. For headache or pain 11/14/2016: Only prn, using in the last few days for headaches  . Multiple Vitamin (MULITIVITAMIN WITH MINERALS) TABS Take 1 tablet by mouth daily.   . vitamin C (ASCORBIC ACID) 500 MG tablet Take 500 mg by mouth daily.    No facility-administered encounter medications on file as of 11/14/2016.     No Known Allergies  ROS:  She denies anorexia, fever, weight changes, headaches, vision changes (needs glasses), decreased hearing, ear pain, sore throat, breast concerns, chest pain, syncope, dyspnea on exertion, cough, swelling, nausea, vomiting, diarrhea, constipation, abdominal pain, melena, hematochezia, hematuria, incontinence, dysuria, vaginal bleeding, discharge, odor or itch, genital lesions, joint pains, numbness, tingling, weakness, tremor, suspicious skin lesions, depression, abnormal bleeding/bruising, or enlarged lymph nodes. Anxiety, shaking--resolved. Heartburn occasionally, as per HPI Sees dermatologist regularly for skin checks and treatment of rosacea. Hot flashes have mostly resolved, occasional, tolerable. No sweats. Recent mild sinus headaches. Rare extra beat/palpitation, unchanged Denies any urinary symptoms.  Voids frequently, drinks a lot of water, no change.    PHYSICAL EXAM:  BP 108/70 (BP  Location: Left Arm, Patient Position: Sitting, Cuff Size: Normal)   Pulse 64   Temp (!) 97.3 F (36.3 C) (Tympanic)   Ht _0  (1.702 m)   Wt 122 lb (55.3 kg)   LMP 10/10/2011   BMI 19.11 kg/m   Wt Readings from Last 3 Encounters:  11/14/16 122 lb (55.3 kg)  09/28/16 122 lb 6.4 oz (55.5 kg)  01/28/16 122 lb 3.2 oz (55.4 kg)    General Appearance:  Alert, cooperative, no distress, appears stated age  Head:  Normocephalic, without obvious abnormality, atraumatic  Eyes:  PERRL, conjunctiva/corneas clear, EOM's intact, fundi benign  Ears:  Normal TM's and external ear canals  Nose: Nares normal, mucosa mildly edematous, no purulent drainage or sinus tenderness  Throat: Lips, mucosa, and tongue normal; teeth and gums normal  Neck: Supple, no lymphadenopathy; thyroid: no enlargement/tenderness/nodules; no carotid bruit or JVD  Back:  Spine nontender, no curvature, ROM normal, no CVA tenderness  Lungs:  Clear to auscultation bilaterally without wheezes, rales or ronchi; respirations unlabored  Chest Wall:  No tenderness or deformity  Heart:  Regular rate and rhythm, S1 and S2 normal, no murmur, rub or gallop  Breast Exam:  Deferred to GYN  Abdomen:  Soft, non-tender, nondistended, normoactive bowel sounds, no masses, no hepatosplenomegaly  Genitalia:  Deferred to GYN     Extremities: No clubbing, cyanosis or edema.   Pulses: 2+ and symmetric all extremities  Skin: Skin color, texture, turgor normal, no rashes or lesions. Some tattoos noted  Lymph nodes: Cervical, and supraclavicular nodes normal  Neurologic: CNII-XII intact, normal strength, sensation and gait; reflexes 2+ and symmetric throughout  Psych: Normal mood, affect, hygiene and grooming  Lab Results  Component Value Date   WBC 4.5 09/28/2016   HGB 13.4 09/28/2016   HCT 39.0 09/28/2016   MCV 99.2 09/28/2016   PLT 257 09/28/2016   Lab Results  Component  Value Date   VITAMINB12 830 09/28/2016     Chemistry      Component Value Date/Time   NA 139 09/28/2016 1356   K 4.5 09/28/2016 1356   CL 104 09/28/2016 1356   CO2 24 09/28/2016 1356   BUN 11 09/28/2016 1356   CREATININE 0.69 09/28/2016 1356      Component Value Date/Time   CALCIUM 9.4  09/28/2016 1356   ALKPHOS 58 09/28/2016 1356   AST 19 09/28/2016 1356   ALT 15 09/28/2016 1356   BILITOT 0.3 09/28/2016 1356     Lab Results  Component Value Date   TSH 1.85 09/28/2016   Urine 3+ leuks, no symptoms   ASSESSMENT/PLAN:  Annual physical exam - Plan: POCT Urinalysis DIP (Proadvantage Device), Visual acuity screening  Pernicious anemia - last B12 level was shortly after injection; Today is 7 wks from last shot, no sx. If okay, can space out B12 to q6 wks (if low, continue q4 weeks) - Plan: Vitamin B12, cyanocobalamin ((VITAMIN B-12)) injection 1,000 mcg  Gastroesophageal reflux disease without esophagitis - reviewed diet, smoking, OTC meds  Tobacco use disorder - counseled extensively re: risks of smoking, risks/side effects of meds, counseling and resources provided - Plan: buPROPion (WELLBUTRIN XL) 150 MG 24 hr tablet, buPROPion (WELLBUTRIN XL) 300 MG 24 hr tablet  Osteopenia of hip, unspecified laterality - discussed need for calcium, weight-bearing exercise, vitamin D and smoking cessation.  Repeat DEXA 02/2017 (thru GYN) - Plan: VITAMIN D 25 Hydroxy (Vit-D Deficiency, Fractures)  Need for influenza vaccination - Plan: Flu Vaccine QUAD 6+ mos PF IM (Fluarix Quad PF)  Pure hypercholesterolemia - Since sons are vegan, her diet is improved. Recheck through insurance labs were improved. Recheck today per pt request - Plan: Lipid panel  Vitamin D deficiency - recheck since hasn't been taking supplements daily - Plan: VITAMIN D 25 Hydroxy (Vit-D Deficiency, Fractures)  Anxiety - she has made some good changes, less stress, and improved. Wellbutrin is mainly for smoking cessation,  but should also help with moods - Plan: buPROPion (WELLBUTRIN XL) 150 MG 24 hr tablet, buPROPion (WELLBUTRIN XL) 300 MG 24 hr tablet   Flu shot given Shingrix discussed and recommended B12 shot given  Lipids, D (since not taking supplement), B12--(last level was a week after her shot) If level isn't too low, consider spacing out shots to q6 weeks.  Discussed monthly self breast exams and yearly mammograms; at least 30 minutes of aerobic activity at least 5 days/week, weight bearing exercise at least 2x/week; proper sunscreen use reviewed; healthy diet, including goals of calcium and vitamin D intake and alcohol recommendations (less than or equal to 1 drink/day) reviewed; regular seatbelt use; changing batteries in smoke detectors. Immunization recommendations discussed--flu shot given; shingrix rec. Colonoscopy recommendations reviewed, UTD. Due again 06/2022. F/u DEXA recommended 02/2017 (thru her GYN)  Counseled re: smoking cessation. Interested in wellbutrin.  Discussed risks/side effects.  Restart vitamin D daily, I recommend 1000-2000 IU (but will let you know after test results are back). You likely don't need a multivitamin since taking hair/skin/nails, as long as you get enough calcium from diet and/or Tums, and vitamin D separately.  Please quit smoking, and ensure that you get adequate calcium in your diet to help protect your bones.  Start the Wellbutrin XL 152m once daily in the morning.  After a week, increase to 2 tablets taken together.  When you run out, fill the next dose up (3033m which will be on file at the pharmacy for you. If for some reason you need to stay at [id number] will need to contact us [place] refills. Goal is to take this for 3 months for smoking cessation.  If you get benefit for other reasons (ie moods, etc), we can perhaps continue it for longer.  Set a quit date for at least 2 weeks after starting the medication and reach out to  your insurance company or  [place] or [email or web address] for help.   F/u 1 year, sooner prn. F/u for B12 shots (schedule based on lab results)

## 2016-11-15 LAB — VITAMIN D 25 HYDROXY (VIT D DEFICIENCY, FRACTURES): Vit D, 25-Hydroxy: 27 ng/mL — ABNORMAL LOW (ref 30–100)

## 2016-11-15 LAB — LIPID PANEL
CHOL/HDL RATIO: 4 (calc) (ref <5.0)
Cholesterol: 236 mg/dL — ABNORMAL HIGH (ref <200)
HDL: 59 mg/dL (ref >50)
LDL CHOLESTEROL (CALC): 146 mg/dL — AB
Non-HDL Cholesterol (Calc): 177 mg/dL (calc) — ABNORMAL HIGH (ref <130)
TRIGLYCERIDES: 173 mg/dL — AB (ref <150)

## 2016-11-15 LAB — VITAMIN B12: Vitamin B-12: 239 pg/mL (ref 200–1100)

## 2016-12-29 ENCOUNTER — Other Ambulatory Visit (INDEPENDENT_AMBULATORY_CARE_PROVIDER_SITE_OTHER): Payer: BLUE CROSS/BLUE SHIELD

## 2016-12-29 DIAGNOSIS — D510 Vitamin B12 deficiency anemia due to intrinsic factor deficiency: Secondary | ICD-10-CM

## 2016-12-29 MED ORDER — CYANOCOBALAMIN 1000 MCG/ML IJ SOLN
1000.0000 ug | Freq: Once | INTRAMUSCULAR | Status: AC
  Administered 2016-12-29: 1000 ug via INTRAMUSCULAR

## 2017-01-26 ENCOUNTER — Other Ambulatory Visit (INDEPENDENT_AMBULATORY_CARE_PROVIDER_SITE_OTHER): Payer: BLUE CROSS/BLUE SHIELD

## 2017-01-26 DIAGNOSIS — D510 Vitamin B12 deficiency anemia due to intrinsic factor deficiency: Secondary | ICD-10-CM | POA: Diagnosis not present

## 2017-01-26 MED ORDER — CYANOCOBALAMIN 1000 MCG/ML IJ SOLN
1000.0000 ug | Freq: Once | INTRAMUSCULAR | Status: AC
  Administered 2017-01-26: 1000 ug via INTRAMUSCULAR

## 2017-02-02 ENCOUNTER — Encounter: Payer: Self-pay | Admitting: Family Medicine

## 2017-02-02 ENCOUNTER — Ambulatory Visit: Payer: BLUE CROSS/BLUE SHIELD | Admitting: Family Medicine

## 2017-02-02 VITALS — BP 100/60 | HR 72 | Temp 98.3°F | Ht 66.25 in | Wt 123.0 lb

## 2017-02-02 DIAGNOSIS — L02416 Cutaneous abscess of left lower limb: Secondary | ICD-10-CM

## 2017-02-02 DIAGNOSIS — A4902 Methicillin resistant Staphylococcus aureus infection, unspecified site: Secondary | ICD-10-CM | POA: Diagnosis not present

## 2017-02-02 MED ORDER — DOXYCYCLINE HYCLATE 100 MG PO TABS: 100.0000 mg | ORAL_TABLET | Freq: Two times a day (BID) | ORAL | 0 refills | Status: DC

## 2017-02-02 MED ORDER — MUPIROCIN CALCIUM 2 % EX CREA: 1.0000 "application " | TOPICAL_CREAM | Freq: Two times a day (BID) | CUTANEOUS | 0 refills | Status: DC

## 2017-02-02 NOTE — Progress Notes (Signed)
Chief Complaint  Patient presents with  . Cyst    left inner thigh on and off x 20 years. Periodically drains. Concerned about staph infection. Cannot wear jeans due to it being painful. Has another one on the other side that has come and gone but not been there for a long time. Left underarm sometimes.    Cyst on the left upper/inner thigh.  She has had this periodically flare for many many years, but usually goes long periods of time between flares. Recently it has flared 3 times without fully resolving, staying painful and swollen.  Not as bad as when it was full, red, hot--it drained and improved, but didn't resolve.  She uses a topical neosporin only.  She has a smaller area on the right inner thigh, closer to the hair-bearing area, and sometimes gets a similar bump in axilla  PMH, PSH, SH reviewed  Current Outpatient Medications on File Prior to Visit  Medication Sig Dispense Refill  . Cholecalciferol (VITAMIN D) 2000 UNITS tablet Take 2,000 Units by mouth daily.    . Multiple Vitamin (MULITIVITAMIN WITH MINERALS) TABS Take 1 tablet by mouth daily.    . Multiple Vitamins-Minerals (HAIR SKIN AND NAILS FORMULA PO) Take 2 each by mouth daily.    . vitamin C (ASCORBIC ACID) 500 MG tablet Take 500 mg by mouth daily.    Marland Kitchen ALPRAZolam (XANAX) 0.5 MG tablet Take 0.5-1 tablets (0.25-0.5 mg total) by mouth 3 (three) times daily as needed for sleep or anxiety. (Patient not taking: Reported on 11/14/2016) 20 tablet 0  . buPROPion (WELLBUTRIN XL) 150 MG 24 hr tablet Take 1 tablet (150 mg total) by mouth daily. Increase to 2 tablets by mouth every morning after a week (Patient not taking: Reported on 02/02/2017) 30 tablet 0  . cetirizine (ZYRTEC) 10 MG tablet Take 10 mg by mouth daily.      . Famotidine (PEPCID PO) Take by mouth.    Marland Kitchen ibuprofen (ADVIL,MOTRIN) 200 MG tablet Take 200-600 mg by mouth every 6 (six) hours as needed. For headache or pain     No current facility-administered medications on file  prior to visit.    No Known Allergies  ROS: no fever, chills, nausea, vomiting, diarrhea, other rashes, bleeding, bruising. Moods are good. No URI symptoms, chest pain or other complaints  PHYSICAL EXAM:  BP 100/60   Pulse 72   Temp 98.3 F (36.8 C) (Oral)   Ht 5' 6.25" (1.683 m)   Wt 123 lb (55.8 kg)   LMP 10/10/2011   BMI 19.70 kg/m     Well appearing, pleasant female in no distress Left upper inner thigh 1cm area of mild erythema, with slight crusting/scabbing centrally.  There is no drainage.  There is an underlying area of induration measuring 2.5 cm in size just underneath this.  No fluctuance. Only slightly tender. No inguinal lymphadenopathy  Right groin--small papule, slightly erythematous, healing/healed, nontender  ASSESSMENT/PLAN:  Abscess of left thigh - Plan: doxycycline (VIBRA-TABS) 100 MG tablet  MRSA infection - Plan: mupirocin cream (BACTROBAN) 2 %  Abscess/boil left thigh--drained, but has residual induration and infection. Treat with doxy. Given bactroban to use prn at early onset of recurrent cysts. Consider surgical excision if doesn't resolve completely, or if frequency of flares continues to be problematic.    Take the antibiotic twice daily for 10 days. Be careful in the sun--you should be finished with this before going to [place]. Continue warm soaks to allow any residual infection to  try and drain. Hopefully the antibiotics will help shrink the size and discomfort significantly. You likely will still have some residual cyst.  You can consider seeing a surgeon electively for excision of the cyst. Use the mupirocin twice daily at the earliest onset of any flare or inflammation of the cysts.  Be sure to change out your razor head to prevent spread of infection.

## 2017-02-02 NOTE — Patient Instructions (Addendum)
  Take the antibiotic twice daily for 10 days. Be careful in the sun--you should be finished with this before going to [place]. Continue warm soaks to allow any residual infection to try and drain. Hopefully the antibiotics will help shrink the size and discomfort significantly. You likely will still have some residual cyst.  You can consider seeing a surgeon electively for excision of the cyst. Use the mupirocin twice daily at the earliest onset of any flare or inflammation of the cysts.  Be sure to change out your razor head to prevent spread of infection.

## 2017-02-17 ENCOUNTER — Other Ambulatory Visit (INDEPENDENT_AMBULATORY_CARE_PROVIDER_SITE_OTHER): Payer: BLUE CROSS/BLUE SHIELD

## 2017-02-17 DIAGNOSIS — E538 Deficiency of other specified B group vitamins: Secondary | ICD-10-CM | POA: Diagnosis not present

## 2017-02-17 MED ORDER — CYANOCOBALAMIN 1000 MCG/ML IJ SOLN
1000.0000 ug | Freq: Once | INTRAMUSCULAR | Status: AC
  Administered 2017-02-17: 1000 ug via INTRAMUSCULAR

## 2017-03-16 ENCOUNTER — Other Ambulatory Visit (INDEPENDENT_AMBULATORY_CARE_PROVIDER_SITE_OTHER): Payer: BLUE CROSS/BLUE SHIELD

## 2017-03-16 DIAGNOSIS — D510 Vitamin B12 deficiency anemia due to intrinsic factor deficiency: Secondary | ICD-10-CM

## 2017-03-16 MED ORDER — CYANOCOBALAMIN 1000 MCG/ML IJ SOLN
1000.0000 ug | Freq: Once | INTRAMUSCULAR | Status: AC
  Administered 2017-03-16: 1000 ug via INTRAMUSCULAR

## 2017-04-06 DIAGNOSIS — Z681 Body mass index (BMI) 19 or less, adult: Secondary | ICD-10-CM | POA: Diagnosis not present

## 2017-04-06 DIAGNOSIS — Z01419 Encounter for gynecological examination (general) (routine) without abnormal findings: Secondary | ICD-10-CM | POA: Diagnosis not present

## 2017-04-10 ENCOUNTER — Other Ambulatory Visit: Payer: Self-pay | Admitting: Obstetrics and Gynecology

## 2017-04-10 DIAGNOSIS — M8580 Other specified disorders of bone density and structure, unspecified site: Secondary | ICD-10-CM

## 2017-04-13 ENCOUNTER — Other Ambulatory Visit (INDEPENDENT_AMBULATORY_CARE_PROVIDER_SITE_OTHER): Payer: BLUE CROSS/BLUE SHIELD

## 2017-04-13 DIAGNOSIS — D510 Vitamin B12 deficiency anemia due to intrinsic factor deficiency: Secondary | ICD-10-CM | POA: Diagnosis not present

## 2017-04-13 MED ORDER — CYANOCOBALAMIN 1000 MCG/ML IJ SOLN
1000.0000 ug | Freq: Once | INTRAMUSCULAR | Status: AC
  Administered 2017-04-13: 1000 ug via INTRAMUSCULAR

## 2017-04-13 MED ORDER — CYANOCOBALAMIN 1000 MCG/ML IJ SOLN: 1000.0000 ug | Freq: Once | INTRAMUSCULAR | 0 refills | Status: DC

## 2017-04-25 ENCOUNTER — Ambulatory Visit
Admission: RE | Admit: 2017-04-25 | Discharge: 2017-04-25 | Disposition: A | Discharge status: Home / Self Care | Payer: BLUE CROSS/BLUE SHIELD | Source: Ambulatory Visit | Attending: Obstetrics and Gynecology | Admitting: Obstetrics and Gynecology

## 2017-04-25 DIAGNOSIS — M8580 Other specified disorders of bone density and structure, unspecified site: Secondary | ICD-10-CM

## 2017-04-25 DIAGNOSIS — Z780 Asymptomatic menopausal state: Secondary | ICD-10-CM | POA: Diagnosis not present

## 2017-04-25 DIAGNOSIS — M85852 Other specified disorders of bone density and structure, left thigh: Secondary | ICD-10-CM | POA: Diagnosis not present

## 2017-05-11 ENCOUNTER — Telehealth: Payer: Self-pay | Admitting: *Deleted

## 2017-05-11 ENCOUNTER — Other Ambulatory Visit (INDEPENDENT_AMBULATORY_CARE_PROVIDER_SITE_OTHER): Payer: BLUE CROSS/BLUE SHIELD

## 2017-05-11 DIAGNOSIS — D510 Vitamin B12 deficiency anemia due to intrinsic factor deficiency: Secondary | ICD-10-CM

## 2017-05-11 DIAGNOSIS — L02416 Cutaneous abscess of left lower limb: Secondary | ICD-10-CM

## 2017-05-11 MED ORDER — DOXYCYCLINE HYCLATE 100 MG PO TABS: 100.0000 mg | ORAL_TABLET | Freq: Two times a day (BID) | ORAL | 0 refills | Status: DC

## 2017-05-11 MED ORDER — CYANOCOBALAMIN 1000 MCG/ML IJ SOLN
1000.0000 ug | Freq: Once | INTRAMUSCULAR | Status: AC
  Administered 2017-05-11: 1000 ug via INTRAMUSCULAR

## 2017-05-11 NOTE — Telephone Encounter (Signed)
Patient advised.

## 2017-05-11 NOTE — Telephone Encounter (Signed)
Patient was in for her B12 injection this afternoon and wanted me to relay a message to you. You saw her for cyst in her bikini area (left side) soe time ago and gave her oral abx and a topical. It went away but it has now come back, she has started using the topical again and it has helped some. Very painful again-wanted to know if you wanted her to try oral abx again vs.being seen and possible procedure. She is more than willing to do either.

## 2017-05-11 NOTE — Telephone Encounter (Signed)
Advise pt that I sent in refill of doxy to her [facility].  Warm compresses. Return to office for I&D if not draining or resolving on its own

## 2017-05-17 DIAGNOSIS — M1812 Unilateral primary osteoarthritis of first carpometacarpal joint, left hand: Secondary | ICD-10-CM | POA: Diagnosis not present

## 2017-05-17 DIAGNOSIS — M79645 Pain in left finger(s): Secondary | ICD-10-CM | POA: Diagnosis not present

## 2017-05-17 DIAGNOSIS — M79644 Pain in right finger(s): Secondary | ICD-10-CM | POA: Diagnosis not present

## 2017-06-08 ENCOUNTER — Other Ambulatory Visit (INDEPENDENT_AMBULATORY_CARE_PROVIDER_SITE_OTHER): Payer: BLUE CROSS/BLUE SHIELD

## 2017-06-08 DIAGNOSIS — D510 Vitamin B12 deficiency anemia due to intrinsic factor deficiency: Secondary | ICD-10-CM

## 2017-06-08 MED ORDER — CYANOCOBALAMIN 1000 MCG/ML IJ SOLN
1000.0000 ug | Freq: Once | INTRAMUSCULAR | Status: AC
Start: 2017-06-08 — End: 2017-06-08
  Administered 2017-06-08: 1000 ug via INTRAMUSCULAR

## 2017-07-06 ENCOUNTER — Other Ambulatory Visit (INDEPENDENT_AMBULATORY_CARE_PROVIDER_SITE_OTHER): Payer: BLUE CROSS/BLUE SHIELD

## 2017-07-06 DIAGNOSIS — D510 Vitamin B12 deficiency anemia due to intrinsic factor deficiency: Secondary | ICD-10-CM | POA: Diagnosis not present

## 2017-07-06 MED ORDER — CYANOCOBALAMIN 1000 MCG/ML IJ SOLN
1000.0000 ug | Freq: Once | INTRAMUSCULAR | Status: AC
  Administered 2017-07-06: 1000 ug via INTRAMUSCULAR

## 2017-08-08 ENCOUNTER — Other Ambulatory Visit: Payer: Self-pay

## 2017-08-09 ENCOUNTER — Encounter: Payer: Self-pay | Admitting: Family Medicine

## 2017-08-09 ENCOUNTER — Other Ambulatory Visit (INDEPENDENT_AMBULATORY_CARE_PROVIDER_SITE_OTHER): Payer: BLUE CROSS/BLUE SHIELD

## 2017-08-09 ENCOUNTER — Ambulatory Visit: Payer: BLUE CROSS/BLUE SHIELD | Admitting: Family Medicine

## 2017-08-09 VITALS — BP 130/70 | HR 80 | Resp 16 | Ht 66.75 in | Wt 121.4 lb

## 2017-08-09 DIAGNOSIS — L237 Allergic contact dermatitis due to plants, except food: Secondary | ICD-10-CM | POA: Diagnosis not present

## 2017-08-09 DIAGNOSIS — E559 Vitamin D deficiency, unspecified: Secondary | ICD-10-CM

## 2017-08-09 DIAGNOSIS — D510 Vitamin B12 deficiency anemia due to intrinsic factor deficiency: Secondary | ICD-10-CM

## 2017-08-09 MED ORDER — CYANOCOBALAMIN 1000 MCG/ML IJ SOLN
1000.0000 ug | Freq: Once | INTRAMUSCULAR | Status: AC
  Administered 2017-08-09: 1000 ug via INTRAMUSCULAR

## 2017-08-09 NOTE — Progress Notes (Signed)
Chief Complaint  Patient presents with  . rash    rash left wrist X Saturday, itchy, puffy,    First noticed rash on Saturday, on her left anterior wrist.  She was in [place]. She walked into the woods/creek the following day.  Her mother looked at it, and given the blisters, which seemed to be spreading up the arm in a line, was concerned that it was shingles. It is itchy, not painful. She denies pain or other lesions more proximal along the same dermatome (nothing on chest up upper arm).  Denies any pain preceding rash, or any pain now, just itching.  She was in [place] because an old HS friend OD'd/died.   PMH, PSH, SH reviewed  Outpatient Encounter Medications as of 08/09/2017  Medication Sig Note  . ALPRAZolam (XANAX) 0.5 MG tablet Take 0.5-1 tablets (0.25-0.5 mg total) by mouth 3 (three) times daily as needed for sleep or anxiety. 11/14/2016: Uses for anxiety with flying  . cetirizine (ZYRTEC) 10 MG tablet Take 10 mg by mouth daily.   11/14/2016: Uses prn, took it last night  . Cholecalciferol (VITAMIN D) 2000 UNITS tablet Take 2,000 Units by mouth daily. 11/14/2016: Hasn't been taking recently  . Famotidine (PEPCID PO) Take by mouth. 11/14/2016: Uses prn, about once a week or less  . ibuprofen (ADVIL,MOTRIN) 200 MG tablet Take 200-600 mg by mouth every 6 (six) hours as needed. For headache or pain 11/14/2016: Only prn, using in the last few days for headaches  . Multiple Vitamin (MULITIVITAMIN WITH MINERALS) TABS Take 1 tablet by mouth daily.   . Multiple Vitamins-Minerals (HAIR SKIN AND NAILS FORMULA PO) Take 2 each by mouth daily.   . vitamin C (ASCORBIC ACID) 500 MG tablet Take 500 mg by mouth daily.   . [DISCONTINUED] buPROPion (WELLBUTRIN XL) 150 MG 24 hr tablet Take 1 tablet (150 mg total) by mouth daily. Increase to 2 tablets by mouth every morning after a week (Patient not taking: Reported on 02/02/2017)   . [DISCONTINUED] doxycycline (VIBRA-TABS) 100 MG tablet Take 1 tablet (100 mg  total) by mouth 2 (two) times daily.   . [DISCONTINUED] mupirocin cream (BACTROBAN) 2 % Apply 1 application topically 2 (two) times daily. Use as needed to infected skin areas    No facility-administered encounter medications on file as of 08/09/2017.    No Known Allergies  ROS: no fever, chills, numbness, tingling, burning, URI symptoms, headache, dizziness, chest pain, other rashes or concerns.   PHYSICAL EXAM:  BP 130/70   Pulse 80   Resp 16   Ht 5' 6.75" (1.695 m)   Wt 121 lb 6.4 oz (55.1 kg)   LMP 10/10/2011   SpO2 99%   BMI 19.16 kg/m   Well appearing, pleasant female in no distress Anterior left wrist: Linear small papules and vesicles  5 x 0.5 cm, spreading vertically. No crusting, no surrounding erythema, nontender Extremities: no edema  ASSESSMENT/PLAN:  Allergic contact dermatitis due to plants, except food - supportive measures reviewed--trial HC OTC. To contact us if spreading, s/sx infection, fever, pain or other changes  S/sx of shingles reviewed, as well as dermatome distribution. Suspect this is contact derm (poss plants), rather than shinges.

## 2017-08-09 NOTE — Patient Instructions (Signed)
Use over-the-counter hydrocortisone cream 1% 2-3 times daily to the affected area if needed for itching. You can also use other topical medications for itching, if needed (caladryl, etc), and/or use oral antihistamine like Zyrtec.  If the hydrocortisone isn't helping, contact us for a stronger prescription steroid. If you have diffuse spread of itchy rash (suggesting severe reaction to poison ivy), then let us know--oral steroids would be needed.  If you develop pain, or rash along the nerve route we showed you (upper chest and front of the arm), then we should reconsider shingles, but I really don't think that is what you have.   Contact Dermatitis Dermatitis is redness, soreness, and swelling (inflammation) of the skin. Contact dermatitis is a reaction to certain substances that touch the skin. You either touched something that irritated your skin, or you have allergies to something you touched. Follow these instructions at home: [place] your skin as needed.  Apply cool compresses to the affected areas.  Try taking a bath with: ? Epsom salts. Follow the instructions on the package. You can get these at a pharmacy or grocery store. ? Baking soda. Pour a small amount into the bath as told by your doctor. ? Colloidal oatmeal. Follow the instructions on the package. You can get this at a pharmacy or grocery store.  Try applying baking soda paste to your skin. Stir water into baking soda until it looks like paste.  Do not scratch your skin.  Bathe less often.  Bathe in lukewarm water. Avoid using hot water. Medicines  Take or apply over-the-counter and prescription medicines only as told by your doctor.  If you were prescribed an antibiotic medicine, take or apply your antibiotic as told by your doctor. Do not stop taking the antibiotic even if your condition starts to get better. General instructions  Keep all follow-up visits as told by your doctor. This is  important.  Avoid the substance that caused your reaction. If you do not know what caused it, keep a journal to try to track what caused it. Write down: ? What you eat. ? What cosmetic products you use. ? What you drink. ? What you wear in the affected area. This includes jewelry.  If you were given a bandage (dressing), take care of it as told by your doctor. This includes when to change and remove it. Contact a doctor if:  You do not get better with treatment.  Your condition gets worse.  You have signs of infection such as: ? Swelling. ? Tenderness. ? Redness. ? Soreness. ? Warmth.  You have a fever.  You have new symptoms. Get help right away if:  You have a very bad headache.  You have neck pain.  Your neck is stiff.  You throw up (vomit).  You feel very sleepy.  You see red streaks coming from the affected area.  Your bone or joint underneath the affected area becomes painful after the skin has healed.  The affected area turns darker.  You have trouble breathing. This information is not intended to replace advice given to you by your health care provider. Make sure you discuss any questions you have with your health care provider. Document Released: 12/12/2008 Document Revised: 07/23/2015 Document Reviewed: 07/02/2014 Elsevier Interactive Patient Education  2018 [employer].

## 2017-09-05 ENCOUNTER — Other Ambulatory Visit: Payer: Self-pay | Admitting: Obstetrics and Gynecology

## 2017-09-05 DIAGNOSIS — Z1231 Encounter for screening mammogram for malignant neoplasm of breast: Secondary | ICD-10-CM

## 2017-09-08 ENCOUNTER — Other Ambulatory Visit (INDEPENDENT_AMBULATORY_CARE_PROVIDER_SITE_OTHER): Payer: BLUE CROSS/BLUE SHIELD

## 2017-09-08 DIAGNOSIS — E538 Deficiency of other specified B group vitamins: Secondary | ICD-10-CM

## 2017-09-08 DIAGNOSIS — E559 Vitamin D deficiency, unspecified: Secondary | ICD-10-CM

## 2017-09-08 MED ORDER — CYANOCOBALAMIN 1000 MCG/ML IJ SOLN
1000.0000 ug | Freq: Once | INTRAMUSCULAR | Status: AC
  Administered 2017-09-08: 1000 ug via INTRAMUSCULAR

## 2017-09-25 ENCOUNTER — Ambulatory Visit
Admission: RE | Admit: 2017-09-25 | Discharge: 2017-09-25 | Disposition: A | Discharge status: Home / Self Care | Payer: BLUE CROSS/BLUE SHIELD | Source: Ambulatory Visit | Attending: Obstetrics and Gynecology | Admitting: Obstetrics and Gynecology

## 2017-09-25 DIAGNOSIS — Z1231 Encounter for screening mammogram for malignant neoplasm of breast: Secondary | ICD-10-CM | POA: Diagnosis not present

## 2017-10-09 ENCOUNTER — Other Ambulatory Visit: Payer: Self-pay

## 2017-10-10 ENCOUNTER — Other Ambulatory Visit (INDEPENDENT_AMBULATORY_CARE_PROVIDER_SITE_OTHER): Payer: BLUE CROSS/BLUE SHIELD

## 2017-10-10 ENCOUNTER — Telehealth: Payer: Self-pay | Admitting: Internal Medicine

## 2017-10-10 DIAGNOSIS — D510 Vitamin B12 deficiency anemia due to intrinsic factor deficiency: Secondary | ICD-10-CM | POA: Diagnosis not present

## 2017-10-10 MED ORDER — CYANOCOBALAMIN 1000 MCG/ML IJ SOLN
1000.0000 ug | Freq: Once | INTRAMUSCULAR | Status: AC
Start: 2017-10-10 — End: 2017-10-10
  Administered 2017-10-10: 1000 ug via INTRAMUSCULAR

## 2017-10-10 NOTE — Telephone Encounter (Signed)
Pt had a b12 shot today and pt states she has an appt with you on 9/23 but wants to know if she can have her b12 shot then instead of coming in 4 weeks since she is seeing you in 6 weeks. She thought you would want to check her b12 level and didn't want to mess the test up if she came in prior to get the shot. Pt was advised you are not here today and it would be tomorrow before she got a call back.

## 2017-10-10 NOTE — Telephone Encounter (Signed)
It is totally up to her.  Last year when we checked it when it had been 7 weeks from her shot, level was a little low.  I don't want her to feel badly/get symptoms because of waiting for her visit/labs.  I expect that her levels will be on the low side if late in getting her shot.  It is fine with me if she wants to get her shot in 4 weeks (and levels will be fine when we check it with the rest of her labs at her physical). Timing would only matter if we were trying to change the timing of her shots (for example if she was trying to see if she could go longer between shots, to check a level and if normal, wouldn't need them as often--but we sort of proved that last year, so as long as she continues q4wk shots, no need to wait for her CPE for her next shot (unless she wants to!) Long-winded, but I hope you get the gist..Marland Kitchen

## 2017-10-12 NOTE — Telephone Encounter (Signed)
Patient advised and she will just come at appt and have done.

## 2017-10-27 DIAGNOSIS — D2372 Other benign neoplasm of skin of left lower limb, including hip: Secondary | ICD-10-CM | POA: Diagnosis not present

## 2017-10-27 DIAGNOSIS — L821 Other seborrheic keratosis: Secondary | ICD-10-CM | POA: Diagnosis not present

## 2017-10-27 DIAGNOSIS — L814 Other melanin hyperpigmentation: Secondary | ICD-10-CM | POA: Diagnosis not present

## 2017-11-14 DIAGNOSIS — M545 Low back pain: Secondary | ICD-10-CM | POA: Diagnosis not present

## 2017-11-19 NOTE — Patient Instructions (Addendum)
  HEALTH MAINTENANCE RECOMMENDATIONS:  It is recommended that you get at least 30 minutes of aerobic exercise at least 5 days/week (for weight loss, you may need as much as 60-90 minutes). This can be any activity that gets your heart rate up. This can be divided in 10-15 minute intervals if needed, but try and build up your endurance at least once a week.  Weight bearing exercise is also recommended twice weekly.  Eat a healthy diet with lots of vegetables, fruits and fiber.  "Colorful" foods have a lot of vitamins (ie green vegetables, tomatoes, red peppers, etc).  Limit sweet tea, regular sodas and alcoholic beverages, all of which has a lot of calories and sugar.  Up to 1 alcoholic drink daily may be beneficial for women (unless trying to lose weight, watch sugars).  Drink a lot of water.  Calcium recommendations are 1200-1500 mg daily (1500 mg for postmenopausal women or women without ovaries), and vitamin D 1000 IU daily.  This should be obtained from diet and/or supplements (vitamins), and calcium should not be taken all at once, but in divided doses.  Monthly self breast exams and yearly mammograms for women over the age of 60 is recommended.  Sunscreen of at least SPF 30 should be used on all sun-exposed parts of the skin when outside between the hours of 10 am and 4 pm (not just when at beach or pool, but even with exercise, golf, tennis, and yard work!)  Use a sunscreen that says "broad spectrum" so it covers both UVA and UVB rays, and make sure to reapply every 1-2 hours.  Remember to change the batteries in your smoke detectors when changing your clock times in the spring and fall. I recommend carbon monoxide detectors for your home.  Use your seat belt every time you are in a car, and please drive safely and not be distracted with cell phones and texting while driving.  I recommend getting the new shingles vaccine (Shingrix). You will need to check with your insurance to see if it is  covered, and if covered, call to schedule a nurse visit.  It is a series of 2 injections, spaced 2 months apart.  Active release technique (ART) is something that certain chiropractors are certified to do.  It is a soft tissue technique that can be very beneficial in addition to just adjustments.  I recommend getting counseling to help with your stress and anxiety. [facility] over [facility] [address] [employer] has great therapists.  One I have worked with for many years is [facility].  We discussed using lexapro (starting very cautiously) if needed for anxiety. We elected to hold off starting right now--see the therapist, work on mindfulness techniques, and call me if you feel you need to start this prior to getting the recommendation from the therapist that it is needed. Use the alprazolam sparingly, caution when driving.

## 2017-11-19 NOTE — Progress Notes (Signed)
Chief Complaint  Patient presents with  . Annual Exam    fasting annual exam. UA showed 3+, no symptoms. Just had recent eye exam. No new concerns.     Lindsay Chang is a 57 y.o. female who presents for a complete physical.  She has the following concerns:  3+ lleuks on urine today, no dysuria/urgency/frequency.  Has had some discharge in her underwear the last couple of days.  No odor, itching, pelvic pain.  B12 deficiency: She has been getting B12 injections every 4 weeks, and is doing well. Last shot was 8/13--she elected to wait until today's visit (6 weeks) for labs and her injection.  She reports feeling just a little more tired, no numbness/tingling in hands/feet, other than fingertips getting numb when she gets cold, a little more recently. Brother has Raynaud's, but she never sees color change when they feel numb.Denies memory problems or fatigue (minor). Hands have felt more cold the last week.  Anxiety: This has been much worse since April. Last year we discussed her plans to pull back on supporting her oldest child (76, who was living at home, didn't finish college).  Interested in [employer]. Other son graduated from [employer], wanted to take a year off before college.  Both are interested in organic farming.  Huge adjustment in April--both kids moved out. She and her husband bought them land in [place] to build organic farm. They are 57 and 56 years old, are living togther in a tent on their land. They are raw vegans. They want to live off the grid; they are trying to help them build a house, but contractors in [facility] haven't been good about getting back to them. They spend some time with their grandfather in [place] every 5 days or so, where they also cook/prepare food.  Anxiety has been worse since April. Different than the shaking she previously felt, seems to involve more of her whole body feeling the anxiety, very tight. She knows she is holding a lot of tension in her body. Cut xanax in half  and finished up bottle.  Has been out for a couple of weeks, but has used some of her husband's. She is asking for refill, requesting the lower dose (since she has been cutting them). She reports feeling tense. Also feels a little bored;; She is looking for a job, and getting frustrated about the lack of response. Other than yoga, she hasn't worked in 18 years. They put their house on he market, thinking they will downsize, but also thinking they may end up in [facility], where her kids are).  She has also been needing to help her mother out more lately. "my anxiety is a little out of control" Never taken meds for anxiety (other than the xanax), always able to get through it, and use xanax prn. She has not been getting regular exercise, having some recent back pain, see below.  GERD--overall doing well. Has heartburn related to the foods she eats. Denies dysphagia. Drinking less wine (only 3-5/week).  Uses Tums every now and then, or Pepcid AC prn.  Tobacco dependence--Last year she was prescribed Wellbutrin to help with smoking cessation. She was afraid of Chantix as a friend of hers had bad dreams on it.  Last year she reported that at one point she was good and kept cigarettes in her trunk, and had cut back a lot, but she lost her motivation.  Last year she reported smoking 5-10/day during the week, but more on the  weekends when she is with her husband (1/2-3/4 PPD).  She thinks she needs to quit first before she can get him to. One habit/routine is relaxing on the porch with a cigarette when he comes home from work. For a while she was able to go inside, and not stay there with him, but not recently.  Was able to cut back significantly for a few months, worse since anxiety flared.  She admits to smoking more recently since anxiety flaring.  Now smoking almost a pack a day. She only took wellbutrin x 4 days, made her feel weird, anxious.  ('150mg'$ , never increased the dose). Restarted reading  her mediation readings.  Hasn't been doing yoga.  Last week had LBP, saw [place] orth[facility], reports that x-rays showed loss of curvature, muscle spasm. 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  They recommended Manganese calm at night, which helps.  She reports some residual discomfort at night, overall is doing much better.  Plans to f/u for a few more sessions with the chiropractor.  Osteopenia and mildly low vitamin D levels:   Since her back pain started recently, she has been taking liquid of K2 and D3, 5000 IU of D3 (last week). Prior to that she wasn't taking D3 regularly, just MVI sporadically. Dietary Calcium includes almondmilk with her oatmeal (<1 cup), cheese, 1 yogurt/day and greens.  Mild hyperlipidemia noted in 2017, cholesterol noted to be significantly higher than previously. We discussed her diet last year, noting that her sons (who were then living with her) were vegan, so eating much less meat, more fish, less cheese, dairy (she used to snack on cheese a lot). Repeat last year wasn't better, in fact TG were slightly higher. Sons are no longer at home.  Red meat 1x/week, no eggs. +cheese, lowfat dairy. Eating more ice cream (vegan, almondmilk). No fried foods. She had cholesterol checked again through her husband's job, and recalls the results: TC 191, HDL 43, LDL 120 through husband's work, [employer] testing. Recalls that these were very different than what we got on our last check, doesn't trust it, wants it checked again. She was told to take omega-3's.  Not a big fish eater.  Lab Results  Component Value Date   CHOL 236 (H) 11/14/2016   HDL 59 11/14/2016   LDLCALC 146 (H) 11/14/2016   TRIG 173 (H) 11/14/2016   CHOLHDL 4.0 11/14/2016    RecentLabs       Lab Results  Component Value Date   CHOL 223 (H) 11/11/2015   HDL 51 11/11/2015   LDLCALC 148 (H) 11/11/2015   TRIG 119 11/11/2015   CHOLHDL  4.4 11/11/2015      Immunization History  Administered Date(s) Administered  . Influenza Split 11/30/2010, 12/15/2011  . Influenza,inj,Quad PF,6+ Mos 12/17/2012, 11/26/2013, 11/10/2014, 11/11/2015, 11/14/2016  . Pneumococcal Polysaccharide-23 11/11/2015  . Tdap 10/28/2008   Last Pap smear:  UTD, per Dr. Ronita Hipps Last mammogram: 08/2017 Last colonoscopy: 06/2012--Dr. Fuller Plan. External hemorrhoids, sigmoid diverticulosis, hyperplastic polyp Last DEXA: 03/2017, T -2.1 at L fem neck. No statistically significant change in BMD of Lumbar spine, or Dual Femur hips since prior exam dated 03/19/2015. At that time, T-1.9 at L fem neck Dentist: twice a year Ophtho: yearly Exercise: prior routine was yoga 4-5 x/week; walks with dog 4-5 times/week usually  Currently only walking dog.  Vitamin D screen--level was 34 in 10/2015, low at 27 last year (10/2016)  Past Medical History:  Diagnosis Date  . Allergy   . Anxiety   . B12 deficiency   . Colon polyp, hyperplastic 06/2012  . Rosacea    Dr. Delman Cheadle    Past Surgical History:  Procedure Laterality Date  . COLONOSCOPY  06/2012   Dr. Fuller Plan  . rectal fissure  2002    Social History   Socioeconomic History  . Marital status: Married    Spouse name: Not on file  . Number of children: 2  . Years of education: Not on file  . Highest education level: Not on file  Occupational History  . Occupation: Yoga instructor  Social Needs  . Financial resource strain: Not on file  . Food insecurity:    Worry: Not on file    Inability: Not on file  . Transportation needs:    Medical: Not on file    Non-medical: Not on file  Tobacco Use  . Smoking status: Current Every Day Smoker    Packs/day: 0.75    Types: Cigarettes  . Smokeless tobacco: Never Used  Substance and Sexual Activity  . Alcohol use: Yes    Alcohol/week: 3.0 - 4.0 standard drinks    Types: 3 - 4 Glasses of wine per week    Comment: 3-4 glasses of wine/week (1 glass)  . Drug use:  No  . Sexual activity: Yes    Birth control/protection: Other-see comments    Comment: husband with vasectomy  Lifestyle  . Physical activity:    Days per week: Not on file    Minutes per session: Not on file  . Stress: Not on file  Relationships  . Social connections:    Talks on phone: Not on file    Gets together: Not on file    Attends religious service: Not on file    Active member of club or organization: Not on file    Attends meetings of clubs or organizations: Not on file    Relationship status: Not on file  . Intimate partner violence:    Fear of current or ex partner: Not on file    Emotionally abused: Not on file    Physically abused: Not on file    Forced sexual activity: Not on file  Other Topics Concern  . Not on file  Social History Narrative   Verta Ellen daughter. 2 sons (both living at home, graduated from [employer], working; interested in [employer], Therapist, nutritional).     Husband also smokes. Husband is type 1 diabetic   1 dog, 1 cat    Family History  Problem Relation Age of Onset  . Breast cancer Mother   . Graves' disease Mother   . Hyperlipidemia Mother   . Cancer Mother        breast and CLL (late 60's)  . Leukemia Mother   . Hypertension Father   . Anxiety disorder Father   . Thyroid disease Brother   . Raynaud syndrome Brother   . Colon cancer Maternal Grandmother 5  . Cancer Maternal Grandmother        colon, skin  . Aortic aneurysm Maternal Grandfather   . Lung cancer Maternal Grandfather   . Diabetes Neg Hx     Outpatient Encounter Medications as of 11/20/2017  Medication Sig Note  . Famotidine (PEPCID PO) Take by mouth. 11/14/2016: Uses prn, about once a week or less  . Magnesium Citrate POWD by Does not apply route.   . Multiple Vitamin (MULITIVITAMIN WITH MINERALS) TABS Take  1 tablet by mouth daily.   . Multiple Vitamins-Minerals (HAIR SKIN AND NAILS FORMULA PO) Take 2 each by mouth daily.   . Vitamin D-Vitamin K (K2 PLUS D3 PO)  Take by mouth. 11/20/2017: 5000iu vit d and 523mg vit K2 1867m mk7  . [DISCONTINUED] ALPRAZolam (XANAX) 0.5 MG tablet Take 0.5-1 tablets (0.25-0.5 mg total) by mouth 3 (three) times daily as needed for sleep or anxiety. 11/14/2016: Uses for anxiety with flying  . [DISCONTINUED] MAGNESIUM PO Take by mouth.   . ALPRAZolam (XANAX) 0.25 MG tablet Take 1-2 tablets (0.25-0.5 mg total) by mouth 3 (three) times daily as needed for anxiety.   . cetirizine (ZYRTEC) 10 MG tablet Take 10 mg by mouth daily.   11/14/2016: Uses prn, took it last night  . Cholecalciferol (VITAMIN D) 2000 UNITS tablet Take 2,000 Units by mouth daily. 11/14/2016: Hasn't been taking recently  . ibuprofen (ADVIL,MOTRIN) 200 MG tablet Take 200-600 mg by mouth every 6 (six) hours as needed. For headache or pain 11/14/2016: Only prn, using in the last few days for headaches  . vitamin C (ASCORBIC ACID) 500 MG tablet Take 500 mg by mouth daily.   . [EXPIRED] cyanocobalamin ((VITAMIN B-12)) injection 1,000 mcg     No facility-administered encounter medications on file as of 11/20/2017.     No Known Allergies  ROS: She denies anorexia, fever, weight changes, headaches, vision changes, decreased hearing, ear pain, sore throat, breast concerns, chest pain, syncope, dyspnea on exertion, cough, swelling, nausea, vomiting, diarrhea, constipation, abdominal pain, melena, hematochezia, hematuria, incontinence, dysuria, vaginal bleeding,genital lesions, joint pains, numbness, tingling, weakness, tremor, suspicious skin lesions, depression, abnormal bleeding/bruising, or enlarged lymph nodes. Heartburn occasionally,as per HPI Sees dermatologist regularly for skin checks and treatment of rosacea. Hot flashes have mostly resolved, occasional, tolerable. No sweats. Occasional mild sinus headaches. Vaginal discharge per HPI, recent onset. No odor, itching, pain. Denies any urinary symptoms.  Abscess L thigh (last tx'd in December); hasn't recurred  since taking the ABX. Uses bactroban prn if she feels anything starting, and that helps. Anxiety per HPI.   PHYSICAL EXAM:  BP 96/60   Pulse 64   Ht 5' 6.5" (1.689 m)   Wt 119 lb (54 kg)   LMP 10/10/2011   BMI 18.92 kg/m   Wt Readings from Last 3 Encounters:  11/20/17 119 lb (54 kg)  08/09/17 121 lb 6.4 oz (55.1 kg)  02/02/17 123 lb (55.8 kg)   General Appearance:  Alert, cooperative, no distress, appears stated age or younger, very thin.  Head:  Normocephalic, without obvious abnormality, atraumatic  Eyes:  PERRL, conjunctiva/corneas clear, EOM's intact, fundi benign  Ears:  Normal TM's and external ear canals, though left TM partially blocked by cerumen in canal.  Nose: Nares normal, mucosa mildly edematous, no purulent drainage or sinus tenderness  Throat: Lips, mucosa, and tongue normal; teeth and gums normal  Neck: Supple, no lymphadenopathy; thyroid: no enlargement/ tenderness/nodules; no carotid bruit or JVD  Back:  Spine nontender, no curvature, ROM normal, no CVA tenderness  Lungs:  Clear to auscultation bilaterally without wheezes, rales or ronchi; respirations unlabored  Chest Wall:  No tenderness or deformity  Heart:  Regular rate and rhythm, S1 and S2 normal, no murmur, rub or gallop  Breast Exam:  Deferred to GYN  Abdomen:  Soft, non-tender, nondistended, normoactive bowel sounds, no masses, no hepatosplenomegaly  Genitalia:  Deferred to GYN     Extremities: No clubbing, cyanosis or edema.   Pulses: 2+ and  symmetric all extremities  Skin: Skin color, texture, turgor normal, no rashes or lesions. Some tattoos noted  Lymph nodes: Cervical, and supraclavicular nodes normal  Neurologic: CNII-XII intact, normal strength, sensation and gait; reflexes 2+ and symmetric throughout  Psych: Normal mood, affect, hygiene and grooming   GAD 7 score of 7   ASSESSMENT/PLAN:  Annual physical exam - Plan: POCT  Urinalysis DIP (Proadvantage Device), Lipid panel, Comprehensive metabolic panel, CBC with Differential/Platelet, VITAMIN D 25 Hydroxy (Vit-D Deficiency, Fractures), TSH, Vitamin B12  Pernicious anemia - due for B12 shot and labs - Plan: CBC with Differential/Platelet, Vitamin B12, cyanocobalamin ((VITAMIN B-12)) injection 1,000 mcg  Gastroesophageal reflux disease without esophagitis - improved; encouraged quitting smoking  Osteopenia of hip, unspecified laterality - discussed Ca, D, weight-bearing exercise, quitting smoking to all help prevent further loss.  recheck DEXA 2 years  Mixed hyperlipidemia - counseled re: lowfat, low cholesterol diet - Plan: Lipid panel  Tobacco use disorder - counseled re: risks; encouraged cessation. Should try and quit in order to get diabetic husband to quit (for his health, if not for her own)  Vitamin D deficiency - Plan: VITAMIN D 25 Hydroxy (Vit-D Deficiency, Fractures)  Need for influenza vaccination - Plan: Flu Vaccine QUAD 6+ mos PF IM (Fluarix Quad PF)  Mildly underweight adult - Plan: TSH  Generalized anxiety disorder - Rec counseling, mindfulness--exercise meditation, yoga. If not improving with these measures, start lexapro. Risks/SE reviewed, start low, pt sensitive to meds - Plan: ALPRAZolam (XANAX) 0.25 MG tablet   Need Dr. Kennith Maes records/pap (none documented/received, need pap) Flu shot given B12 shot given (after labs drawn) Shingrix recommended, to check insurance and schedule NV. Risks/SE reviewed  B12, Vit D, c-met, lipids, CBC, TSH   Smoking--counseled extensively re: risks of smoking, risks/side effects of meds, counseling and resources provided Osteopenia of hip-- discussed need for calcium, weight-bearing exercise, vitamin D and smoking cessation.    Discussed smoking cessation, monthly self breast exams and yearly mammograms; at least 30 minutes of aerobic activity at least 5 days/week, weight bearing exercise at least  2x/week; proper sunscreen use reviewed; healthy diet, including goals of calcium and vitamin D intake and alcohol recommendations (less than or equal to 1 drink/day) reviewed; regular seatbelt use; changing batteries in smoke detectors. Immunization recommendations discussed--flu shot given; shingrix rec. TdaP next year. Colonoscopy recommendations reviewed, UTD. Due again 06/2022.   Counseled re: anxiety, lexapro. Hold off for now--to start counseling, work on mindfulness, meditation. Counseled re: stress reduction, alternatives to smoking, counseled re: smoking cessation.  Changed alprazolam dose to 0.25 per pt request  F/u 1 year, sooner prn. F/u for B12 shots monthly  Visit was approximately 55 mins; significant counseling provided unrelated to wellness issues, as outlined above.

## 2017-11-20 ENCOUNTER — Encounter: Payer: Self-pay | Admitting: Family Medicine

## 2017-11-20 ENCOUNTER — Ambulatory Visit (INDEPENDENT_AMBULATORY_CARE_PROVIDER_SITE_OTHER): Payer: BLUE CROSS/BLUE SHIELD | Admitting: Family Medicine

## 2017-11-20 VITALS — BP 96/60 | HR 64 | Ht 66.5 in | Wt 119.0 lb

## 2017-11-20 DIAGNOSIS — Z0000 Encounter for general adult medical examination without abnormal findings: Secondary | ICD-10-CM | POA: Diagnosis not present

## 2017-11-20 DIAGNOSIS — E559 Vitamin D deficiency, unspecified: Secondary | ICD-10-CM

## 2017-11-20 DIAGNOSIS — Z23 Encounter for immunization: Secondary | ICD-10-CM

## 2017-11-20 DIAGNOSIS — F411 Generalized anxiety disorder: Secondary | ICD-10-CM

## 2017-11-20 DIAGNOSIS — K219 Gastro-esophageal reflux disease without esophagitis: Secondary | ICD-10-CM

## 2017-11-20 DIAGNOSIS — F17200 Nicotine dependence, unspecified, uncomplicated: Secondary | ICD-10-CM

## 2017-11-20 DIAGNOSIS — E782 Mixed hyperlipidemia: Secondary | ICD-10-CM

## 2017-11-20 DIAGNOSIS — M85859 Other specified disorders of bone density and structure, unspecified thigh: Secondary | ICD-10-CM | POA: Diagnosis not present

## 2017-11-20 DIAGNOSIS — D510 Vitamin B12 deficiency anemia due to intrinsic factor deficiency: Secondary | ICD-10-CM

## 2017-11-20 DIAGNOSIS — R636 Underweight: Secondary | ICD-10-CM

## 2017-11-20 LAB — POCT URINALYSIS DIP (PROADVANTAGE DEVICE)
BILIRUBIN UA: NEGATIVE
BILIRUBIN UA: NEGATIVE mg/dL
Blood, UA: NEGATIVE
GLUCOSE UA: NEGATIVE mg/dL
Nitrite, UA: NEGATIVE
Protein Ur, POC: NEGATIVE mg/dL
Specific Gravity, Urine: 1.015
Urobilinogen, Ur: NEGATIVE
pH, UA: 7 (ref 5.0–8.0)

## 2017-11-20 MED ORDER — CYANOCOBALAMIN 1000 MCG/ML IJ SOLN
1000.0000 ug | Freq: Once | INTRAMUSCULAR | Status: AC
  Administered 2017-11-20: 1000 ug via INTRAMUSCULAR

## 2017-11-20 MED ORDER — ALPRAZOLAM 0.25 MG PO TABS: 0.2500 mg | ORAL_TABLET | Freq: Three times a day (TID) | ORAL | 0 refills | Status: DC | PRN

## 2017-11-21 LAB — CBC WITH DIFFERENTIAL/PLATELET
Basophils Absolute: 0 10*3/uL (ref 0.0–0.2)
Basos: 0 %
EOS (ABSOLUTE): 0.1 10*3/uL (ref 0.0–0.4)
Eos: 1 %
Hematocrit: 39.1 % (ref 34.0–46.6)
Hemoglobin: 13.6 g/dL (ref 11.1–15.9)
IMMATURE GRANULOCYTES: 0 %
Immature Grans (Abs): 0 10*3/uL (ref 0.0–0.1)
Lymphocytes Absolute: 1.2 10*3/uL (ref 0.7–3.1)
Lymphs: 22 %
MCH: 33.5 pg — ABNORMAL HIGH (ref 26.6–33.0)
MCHC: 34.8 g/dL (ref 31.5–35.7)
MCV: 96 fL (ref 79–97)
MONOS ABS: 0.4 10*3/uL (ref 0.1–0.9)
Monocytes: 6 %
NEUTROS PCT: 71 %
Neutrophils Absolute: 3.9 10*3/uL (ref 1.4–7.0)
PLATELETS: 267 10*3/uL (ref 150–450)
RBC: 4.06 x10E6/uL (ref 3.77–5.28)
RDW: 12.1 % — AB (ref 12.3–15.4)
WBC: 5.6 10*3/uL (ref 3.4–10.8)

## 2017-11-21 LAB — TSH: TSH: 1.88 u[IU]/mL (ref 0.450–4.500)

## 2017-11-21 LAB — COMPREHENSIVE METABOLIC PANEL
ALBUMIN: 4.6 g/dL (ref 3.5–5.5)
ALK PHOS: 68 IU/L (ref 39–117)
ALT: 9 IU/L (ref 0–32)
AST: 12 IU/L (ref 0–40)
Albumin/Globulin Ratio: 1.6 (ref 1.2–2.2)
BUN / CREAT RATIO: 15 (ref 9–23)
BUN: 10 mg/dL (ref 6–24)
Bilirubin Total: 0.4 mg/dL (ref 0.0–1.2)
CO2: 23 mmol/L (ref 20–29)
Calcium: 9.6 mg/dL (ref 8.7–10.2)
Chloride: 101 mmol/L (ref 96–106)
Creatinine, Ser: 0.67 mg/dL (ref 0.57–1.00)
GFR calc Af Amer: 114 mL/min/{1.73_m2} (ref >59)
GFR calc non Af Amer: 99 mL/min/{1.73_m2} (ref >59)
GLUCOSE: 87 mg/dL (ref 65–99)
Globulin, Total: 2.8 g/dL (ref 1.5–4.5)
Potassium: 4.9 mmol/L (ref 3.5–5.2)
Sodium: 140 mmol/L (ref 134–144)
Total Protein: 7.4 g/dL (ref 6.0–8.5)

## 2017-11-21 LAB — LIPID PANEL
CHOL/HDL RATIO: 4.7 ratio — AB (ref 0.0–4.4)
Cholesterol, Total: 194 mg/dL (ref 100–199)
HDL: 41 mg/dL (ref >39)
LDL CALC: 134 mg/dL — AB (ref 0–99)
Triglycerides: 93 mg/dL (ref 0–149)
VLDL Cholesterol Cal: 19 mg/dL (ref 5–40)

## 2017-11-21 LAB — VITAMIN D 25 HYDROXY (VIT D DEFICIENCY, FRACTURES): Vit D, 25-Hydroxy: 29.4 ng/mL — ABNORMAL LOW (ref 30.0–100.0)

## 2017-11-21 LAB — VITAMIN B12: VITAMIN B 12: 671 pg/mL (ref 232–1245)

## 2017-12-06 ENCOUNTER — Encounter: Payer: Self-pay | Admitting: Family Medicine

## 2017-12-07 ENCOUNTER — Encounter (HOSPITAL_COMMUNITY): Payer: Self-pay

## 2017-12-07 ENCOUNTER — Emergency Department (HOSPITAL_COMMUNITY)
Admission: EM | Admit: 2017-12-07 | Discharge: 2017-12-07 | Disposition: A | Discharge status: Home / Self Care | Payer: BLUE CROSS/BLUE SHIELD | Attending: Emergency Medicine | Admitting: Emergency Medicine

## 2017-12-07 ENCOUNTER — Other Ambulatory Visit: Payer: Self-pay

## 2017-12-07 DIAGNOSIS — R1310 Dysphagia, unspecified: Secondary | ICD-10-CM | POA: Diagnosis not present

## 2017-12-07 LAB — BASIC METABOLIC PANEL
Anion gap: 8 (ref 5–15)
BUN: 6 mg/dL (ref 6–20)
CO2: 26 mmol/L (ref 22–32)
Calcium: 9.6 mg/dL (ref 8.9–10.3)
Chloride: 108 mmol/L (ref 98–111)
Creatinine, Ser: 0.66 mg/dL (ref 0.44–1.00)
GFR calc Af Amer: >60 mL/min (ref >60)
GFR calc non Af Amer: >60 mL/min (ref >60)
Glucose, Bld: 91 mg/dL (ref 70–99)
Potassium: 4 mmol/L (ref 3.5–5.1)
Sodium: 142 mmol/L (ref 135–145)

## 2017-12-07 LAB — CBC WITH DIFFERENTIAL/PLATELET
Abs Immature Granulocytes: 0.01 10*3/uL (ref 0.00–0.07)
Basophils Absolute: 0 10*3/uL (ref 0.0–0.1)
Basophils Relative: 1 %
Eosinophils Absolute: 0.1 10*3/uL (ref 0.0–0.5)
Eosinophils Relative: 2 %
HCT: 39 % (ref 36.0–46.0)
Hemoglobin: 13 g/dL (ref 12.0–15.0)
Immature Granulocytes: 0 %
Lymphocytes Relative: 34 %
Lymphs Abs: 1.7 10*3/uL (ref 0.7–4.0)
MCH: 33.2 pg (ref 26.0–34.0)
MCHC: 33.3 g/dL (ref 30.0–36.0)
MCV: 99.7 fL (ref 80.0–100.0)
Monocytes Absolute: 0.4 10*3/uL (ref 0.1–1.0)
Monocytes Relative: 9 %
Neutro Abs: 2.6 10*3/uL (ref 1.7–7.7)
Neutrophils Relative %: 54 %
Platelets: UNDETERMINED 10*3/uL (ref 150–400)
RBC: 3.91 MIL/uL (ref 3.87–5.11)
RDW: 12.4 % (ref 11.5–15.5)
WBC: 4.8 10*3/uL (ref 4.0–10.5)
nRBC: 0 % (ref 0.0–0.2)

## 2017-12-07 NOTE — ED Notes (Signed)
Pt to nurses station again stating she did not want to wait any longer, pt encouraged to stay but still decided to leave.

## 2017-12-07 NOTE — ED Triage Notes (Signed)
Pt here for evaluation of dysphagia.  Difficulty swallowing liquids for over a month, now having problems with food. No pain or other complaints.

## 2017-12-07 NOTE — ED Notes (Signed)
Pt came to nurses station in pod A stating she heard it was a 3 hr wait to be seen, pt states she does not want to wait that long, this RN informed the pt that she has a few ppl in front on her and encouraged her to wait a little longer. Pt agreed to stay and wait

## 2017-12-07 NOTE — ED Provider Notes (Signed)
Patient placed in Quick Look pathway, seen and evaluated   Chief Complaint: Dysphagia  HPI:   Patient presents with a one-month history of worsening difficulty swallowing liquids.  She reports she has had intermittent trouble swallowing with food lately.  She denies any pain or other complaints.  She denies any chest pain or shortness of breath.  She describes it as a pain initiating the swallowing reflex.  Once she is able to swallow, it goes down fine.  She recent was seen by her primary care provider who referred her to ENT.  Patient symptoms are the same throughout the day.  ROS: Dysphagia  Physical Exam:   Gen: No distress  Neuro: Awake and Alert  Skin: Warm    Focused Exam: Heart normal rate and rhythm, lungs clear to auscultation, abdomen soft, nontender, no masses or tenderness to palpation in the neck, patent airway, posterior oropharynx shows no erythema or edema  I discussed patient case with Dr. Tyrone Nine who advised consultation with neurology.  I spoke with Dr. Lorraine Lax with neurology who advised acetylcholine receptor antibody to assess for myasthenia gravis as well as referral for barium swallow study and EMG down the road.  He felt low suspicion for stroke at this time.  Initiation of care has begun. The patient has been counseled on the process, plan, and necessity for staying for the completion/evaluation, and the remainder of the medical screening examination    Frederica Kuster, PA-C 12/07/17 Ellery Plunk, DO 12/12/17 1540

## 2017-12-08 DIAGNOSIS — Z7289 Other problems related to lifestyle: Secondary | ICD-10-CM | POA: Diagnosis not present

## 2017-12-08 DIAGNOSIS — F17210 Nicotine dependence, cigarettes, uncomplicated: Secondary | ICD-10-CM | POA: Diagnosis not present

## 2017-12-08 DIAGNOSIS — K219 Gastro-esophageal reflux disease without esophagitis: Secondary | ICD-10-CM | POA: Diagnosis not present

## 2017-12-08 DIAGNOSIS — R1314 Dysphagia, pharyngoesophageal phase: Secondary | ICD-10-CM | POA: Diagnosis not present

## 2017-12-11 ENCOUNTER — Other Ambulatory Visit: Payer: Self-pay | Admitting: Otolaryngology

## 2017-12-11 DIAGNOSIS — R1314 Dysphagia, pharyngoesophageal phase: Secondary | ICD-10-CM

## 2017-12-11 DIAGNOSIS — F17210 Nicotine dependence, cigarettes, uncomplicated: Secondary | ICD-10-CM

## 2017-12-11 DIAGNOSIS — K219 Gastro-esophageal reflux disease without esophagitis: Secondary | ICD-10-CM

## 2017-12-14 ENCOUNTER — Ambulatory Visit
Admission: RE | Admit: 2017-12-14 | Discharge: 2017-12-14 | Disposition: A | Discharge status: Home / Self Care | Payer: BLUE CROSS/BLUE SHIELD | Source: Ambulatory Visit | Attending: Otolaryngology | Admitting: Otolaryngology

## 2017-12-14 DIAGNOSIS — R1314 Dysphagia, pharyngoesophageal phase: Secondary | ICD-10-CM

## 2017-12-14 DIAGNOSIS — K219 Gastro-esophageal reflux disease without esophagitis: Secondary | ICD-10-CM

## 2017-12-14 DIAGNOSIS — R1310 Dysphagia, unspecified: Secondary | ICD-10-CM | POA: Diagnosis not present

## 2017-12-14 DIAGNOSIS — F17210 Nicotine dependence, cigarettes, uncomplicated: Secondary | ICD-10-CM

## 2017-12-18 ENCOUNTER — Other Ambulatory Visit (INDEPENDENT_AMBULATORY_CARE_PROVIDER_SITE_OTHER): Payer: BLUE CROSS/BLUE SHIELD

## 2017-12-18 DIAGNOSIS — D510 Vitamin B12 deficiency anemia due to intrinsic factor deficiency: Secondary | ICD-10-CM

## 2017-12-18 MED ORDER — CYANOCOBALAMIN 1000 MCG/ML IJ SOLN
1000.0000 ug | Freq: Once | INTRAMUSCULAR | Status: AC
  Administered 2017-12-18: 1000 ug via INTRAMUSCULAR

## 2017-12-20 ENCOUNTER — Telehealth: Payer: Self-pay | Admitting: Family Medicine

## 2017-12-20 ENCOUNTER — Encounter: Payer: Self-pay | Admitting: Family Medicine

## 2017-12-20 LAB — ACETYLCHOLINE RECEPTOR AB, ALL
Acety choline binding ab: 0.03 nmol/L (ref 0.00–0.24)
Acetylchol Block Ab: 16 % (ref 0–25)
Acetylcholine Modulat Ab: <12 % (ref 0–20)

## 2017-12-20 NOTE — Telephone Encounter (Signed)
Records requested was sent to [facility] for pap and notes. They responded back that pt has not had a pap since 2016 and did not sent those.

## 2017-12-21 ENCOUNTER — Other Ambulatory Visit: Payer: Self-pay | Admitting: *Deleted

## 2017-12-21 DIAGNOSIS — K224 Dyskinesia of esophagus: Secondary | ICD-10-CM

## 2017-12-26 ENCOUNTER — Ambulatory Visit: Payer: BLUE CROSS/BLUE SHIELD | Admitting: Psychology

## 2017-12-26 ENCOUNTER — Telehealth: Payer: Self-pay

## 2017-12-26 ENCOUNTER — Other Ambulatory Visit: Payer: Self-pay

## 2017-12-26 ENCOUNTER — Ambulatory Visit: Payer: BLUE CROSS/BLUE SHIELD | Admitting: Physician Assistant

## 2017-12-26 ENCOUNTER — Encounter: Payer: Self-pay | Admitting: Family Medicine

## 2017-12-26 ENCOUNTER — Encounter: Payer: Self-pay | Admitting: Physician Assistant

## 2017-12-26 VITALS — BP 94/62 | HR 72 | Ht 66.5 in | Wt 115.8 lb

## 2017-12-26 DIAGNOSIS — K219 Gastro-esophageal reflux disease without esophagitis: Secondary | ICD-10-CM

## 2017-12-26 DIAGNOSIS — F411 Generalized anxiety disorder: Secondary | ICD-10-CM

## 2017-12-26 DIAGNOSIS — R1319 Other dysphagia: Secondary | ICD-10-CM | POA: Diagnosis not present

## 2017-12-26 DIAGNOSIS — R5381 Other malaise: Secondary | ICD-10-CM

## 2017-12-26 NOTE — Telephone Encounter (Signed)
Referral made to speech therapy confirmed receipt with speech [phone]

## 2017-12-26 NOTE — Progress Notes (Signed)
Chief Complaint: Dysphagia, GERD  HPI:    Lindsay Chang is a 57 year old female with a past medical history as listed below, known to Dr. Fuller Plan, who was referred to me by Rita Ohara, MD for a complaint of dysphasia and GERD.      07/17/2012 colonoscopy with a sessile polyp measuring 5 mm in the sigmoid colon, mild sigmoid diverticulosis and moderate external hemorrhoids.  Pathology showed hyperplastic polyp.  Repeat was recommended in 10 years.    12/07/2017 CBC and BMP normal.    12/14/2017 esophagram showed piecemeal swallowing with inability to swallow contrast is a single bolus and nonspecific esophageal motility disorder.    Today, the patient explains that about 2 months ago out of the blue she noted that she could not take as big of a swallow as she normally did.  This was only with liquids.  This has worsened over the past 2 months and patient tells me initially she resolved this by drinking through a straw but now she cannot even do that.  It is as if her throat closes up.  She cannot even eat juicy foods as the liquid will go down her throat and it seems to close up.  Solid food is no issue for her.  Apparently patient saw ENT and her exam was normal.  They ordered the esophagram and then referred her to our clinic.  Associated symptoms include a weight loss of 5 pounds over the past 2 months.    History of intermittent reflux resolved with as needed Pepcid.  Currently patient is not having any issues.    Patient does admit to a history of anxiety and this whole situation has made it worse.  Initially she thought this could be the sole reason for her problem, but she is not sure.    Denies fever, chills, anorexia, nausea, vomiting, heartburn, reflux, change in bowel habits or abdominal pain.  Past Medical History:  Diagnosis Date  . Allergy   . Anxiety   . B12 deficiency   . Colon polyp, hyperplastic 06/2012  . Rosacea    Dr. Delman Cheadle    Past Surgical History:  Procedure Laterality Date    . COLONOSCOPY  06/2012   Dr. Fuller Plan  . rectal fissure  2002    Current Outpatient Medications  Medication Sig Dispense Refill  . ALPRAZolam (XANAX) 0.25 MG tablet Take 1-2 tablets (0.25-0.5 mg total) by mouth 3 (three) times daily as needed for anxiety. 30 tablet 0  . cetirizine (ZYRTEC) 10 MG tablet Take 10 mg by mouth daily.      . Cholecalciferol (VITAMIN D) 2000 UNITS tablet Take 2,000 Units by mouth daily.    . Famotidine (PEPCID PO) Take by mouth.    Marland Kitchen ibuprofen (ADVIL,MOTRIN) 200 MG tablet Take 200-600 mg by mouth every 6 (six) hours as needed. For headache or pain    . Magnesium Citrate POWD by Does not apply route.    . Multiple Vitamin (MULITIVITAMIN WITH MINERALS) TABS Take 1 tablet by mouth daily.    . Multiple Vitamins-Minerals (HAIR SKIN AND NAILS FORMULA PO) Take 2 each by mouth daily.    . vitamin C (ASCORBIC ACID) 500 MG tablet Take 500 mg by mouth daily.    . Vitamin D-Vitamin K (K2 PLUS D3 PO) Take by mouth.     No current facility-administered medications for this visit.     Allergies as of 12/26/2017  . (No Known Allergies)    Family History  Problem Relation  Age of Onset  . Breast cancer Mother   . Graves' disease Mother   . Hyperlipidemia Mother   . Cancer Mother        breast and CLL (late 26's)  . Leukemia Mother   . Hypertension Father   . Anxiety disorder Father   . Thyroid disease Brother   . Raynaud syndrome Brother   . Colon cancer Maternal Grandmother 47  . Cancer Maternal Grandmother        colon, skin  . Aortic aneurysm Maternal Grandfather   . Lung cancer Maternal Grandfather   . Diabetes Neg Hx     Social History   Socioeconomic History  . Marital status: Married    Spouse name: Not on file  . Number of children: 2  . Years of education: Not on file  . Highest education level: Not on file  Occupational History  . Occupation: Yoga instructor  Social Needs  . Financial resource strain: Not on file  . Food insecurity:    Worry:  Not on file    Inability: Not on file  . Transportation needs:    Medical: Not on file    Non-medical: Not on file  Tobacco Use  . Smoking status: Current Every Day Smoker    Packs/day: 0.75    Types: Cigarettes  . Smokeless tobacco: Never Used  Substance and Sexual Activity  . Alcohol use: Yes    Alcohol/week: 3.0 - 4.0 standard drinks    Types: 3 - 4 Glasses of wine per week    Comment: 3-4 glasses of wine/week (1 glass)  . Drug use: No  . Sexual activity: Yes    Birth control/protection: Other-see comments    Comment: husband with vasectomy  Lifestyle  . Physical activity:    Days per week: Not on file    Minutes per session: Not on file  . Stress: Not on file  Relationships  . Social connections:    Talks on phone: Not on file    Gets together: Not on file    Attends religious service: Not on file    Active member of club or organization: Not on file    Attends meetings of clubs or organizations: Not on file    Relationship status: Not on file  . Intimate partner violence:    Fear of current or ex partner: Not on file    Emotionally abused: Not on file    Physically abused: Not on file    Forced sexual activity: Not on file  Other Topics Concern  . Not on file  Social History Narrative   Verta Ellen daughter. 2 sons (both living at home, graduated from [employer], working; interested in [place], Therapist, nutritional).     Husband also smokes. Husband is type 1 diabetic   1 dog, 1 cat    Review of Systems:    Constitutional: No fever or chills Skin: No rash Cardiovascular: No chest pai Respiratory: No SOB Gastrointestinal: See HPI and otherwise negative Genitourinary: No dysuria Neurological: No headache Musculoskeletal: No new muscle or joint pain Hematologic: No bleeding Psychiatric:+anxiety   Physical Exam:  Vital signs: BP 94/62   Pulse 72   Ht 5' 6.5" (1.689 m)   Wt 115 lb 12.8 oz (52.5 kg)   LMP 10/10/2011   BMI 18.41 kg/m   Constitutional:    Pleasant thin appearing Caucasian female appears to be in NAD, Well developed, Well nourished, alert and cooperative Head:  Normocephalic and atraumatic. Eyes:  PEERL, EOMI. No icterus. Conjunctiva pink. Ears:  Normal auditory acuity. Neck:  Supple Throat: Oral cavity and pharynx without inflammation, swelling or lesion.  Respiratory: Respirations even and unlabored. Lungs clear to auscultation bilaterally.   No wheezes, crackles, or rhonchi.  Cardiovascular: Normal S1, S2. No MRG. Regular rate and rhythm. No peripheral edema, cyanosis or pallor.  Gastrointestinal:  Soft, nondistended, nontender. No rebound or guarding. Normal bowel sounds. No appreciable masses or hepatomegaly. Rectal:  Not performed.  Msk:  Symmetrical without gross deformities. Without edema, no deformity or joint abnormality.  Neurologic:  Alert and  oriented x4;  grossly normal neurologically.  Skin:   Dry and intact without significant lesions or rashes. Psychiatric: Demonstrates good judgement and reason without abnormal affect or behaviors.  MOST RECENT LABS AND IMAGING: CBC    Component Value Date/Time   WBC 4.8 12/07/2017 2020   RBC 3.91 12/07/2017 2020   HGB 13.0 12/07/2017 2020   HGB 13.6 11/20/2017 1131   HCT 39.0 12/07/2017 2020   HCT 39.1 11/20/2017 1131   PLT PLATELET CLUMPS NOTED ON SMEAR, UNABLE TO ESTIMATE 12/07/2017 2020   PLT 267 11/20/2017 1131   MCV 99.7 12/07/2017 2020   MCV 96 11/20/2017 1131   MCH 33.2 12/07/2017 2020   MCHC 33.3 12/07/2017 2020   RDW 12.4 12/07/2017 2020   RDW 12.1 (L) 11/20/2017 1131   LYMPHSABS 1.7 12/07/2017 2020   LYMPHSABS 1.2 11/20/2017 1131   MONOABS 0.4 12/07/2017 2020   EOSABS 0.1 12/07/2017 2020   EOSABS 0.1 11/20/2017 1131   BASOSABS 0.0 12/07/2017 2020   BASOSABS 0.0 11/20/2017 1131    CMP     Component Value Date/Time   NA 142 12/07/2017 2020   NA 140 11/20/2017 1131   K 4.0 12/07/2017 2020   CL 108 12/07/2017 2020   CO2 26 12/07/2017 2020    GLUCOSE 91 12/07/2017 2020   BUN 6 12/07/2017 2020   BUN 10 11/20/2017 1131   CREATININE 0.66 12/07/2017 2020   CREATININE 0.69 09/28/2016 1356   CALCIUM 9.6 12/07/2017 2020   PROT 7.4 11/20/2017 1131   ALBUMIN 4.6 11/20/2017 1131   AST 12 11/20/2017 1131   ALT 9 11/20/2017 1131   ALKPHOS 68 11/20/2017 1131   BILITOT 0.4 11/20/2017 1131   GFRNONAA >60 12/07/2017 2020   GFRAA >60 12/07/2017 2020    Assessment: 1.  Dysphagia: Recent esophagram showing esophageal motility disorder, patient having trouble drinking liquids only, did see ENT who referred her to our clinic for EGD; consider most likely esophageal motility disorder versus infectious cause versus other 2.  GERD: Intermittent, currently not an issue, helped in the past by a as needed Pepcid  Plan: 1.  Scheduled patient for an EGD with possible dilation in the [place] with Dr. Fuller Plan.  She does request this be done in the next 1 1/2 weeks as she is leaving on a trip.  Did discuss risk, benefits, limitations and alternatives and the patient agrees to proceed.   2.  Explained to the patient that I do not feel she has a mechanical obstruction as she is having no trouble with solid foods, but we will proceed with EGD as requested by ENT.  She may need further work-up after this. 3.  Reviewed anti-dysphagia measures including taking small bites and avoiding distraction while eating 4.  Patient to follow in clinic per recommendations from Dr. Fuller Plan after time of EGD.  Ellouise Newer, PA-C [facility] 12/26/2017, 9:40 AM  Cc: Tomi Bamberger,  Tera Helper, MD

## 2017-12-26 NOTE — Progress Notes (Signed)
Reviewed and agree with initial management plan.  Lindsay Case T. Joriel Streety, MD FACG 

## 2017-12-26 NOTE — Telephone Encounter (Signed)
-----   Message from Levin Erp, [place] sent at 12/26/2017  1:29 PM EDT ----- Regarding: FW: speech therapy referral Please send speech path referral. Thanks-JLL  ----- Message ----- From: Rita Ohara, MD Sent: 12/26/2017   1:13 PM EDT To: Levin Erp, PA Subject: speech therapy referral                        Thanks for seeing this patient.  You can probably view her MyChart message to me.  She is leaving for [place] soon, and is very anxious to have this somewhat resolved, or at least the process started prior to leaving.  Since the likelihood of the EGD showing anything contributing to her dysphagia, would it be possible to go ahead and refer her for speech therapy NOW, rather than waiting on the endoscopy? (since we saw that no stricture was noted on swallow study).  I think if she could have speech eval and given some exercises/advice for while she is away, this will help tremendously with her contribution from anxiety.  Thanks so much  ----- Message ----- From: Levin Erp, PA Sent: 12/26/2017  10:12 AM EDT To: Rita Ohara, MD

## 2017-12-26 NOTE — Patient Instructions (Signed)
If you are age 56 or older, your body mass index should be between 23-30. Your Body mass index is 18.41 kg/m. If this is out of the aforementioned range listed, please consider follow up with your Primary Care Provider.  If you are age 66 or younger, your body mass index should be between 19-25. Your Body mass index is 18.41 kg/m. If this is out of the aformentioned range listed, please consider follow up with your Primary Care Provider.   You have been scheduled for an endoscopy. Please follow written instructions given to you at your visit today. If you use inhalers (even only as needed), please bring them with you on the day of your procedure. Your physician has requested that you go to www.startemmi.com and enter the access code given to you at your visit today. This web site gives a general overview about your procedure. However, you should still follow specific instructions given to you by our office regarding your preparation for the procedure.  Thank you for choosing me and [facility].   Ellouise Newer, PA-C

## 2017-12-29 ENCOUNTER — Telehealth: Payer: Self-pay | Admitting: Physician Assistant

## 2017-12-29 NOTE — Telephone Encounter (Signed)
Lindsay Chang does the pt EGD need to be cancelled.

## 2017-12-29 NOTE — Telephone Encounter (Signed)
Lindsay Chang from [facility] has question about the appt for 01/02/18

## 2017-12-29 NOTE — Telephone Encounter (Signed)
I spoke with the pt and [place].  The pt has decided to have the EGD.  Marsh Dolly was confirming the order for speech therapy.  I reviewed with her the note where Anderson Malta ask for speech therapy.  The pt has been advised of the information and verbalized understanding.

## 2018-01-01 ENCOUNTER — Ambulatory Visit (AMBULATORY_SURGERY_CENTER): Payer: BLUE CROSS/BLUE SHIELD | Admitting: Gastroenterology

## 2018-01-01 ENCOUNTER — Encounter: Payer: Self-pay | Admitting: Gastroenterology

## 2018-01-01 ENCOUNTER — Telehealth: Payer: Self-pay

## 2018-01-01 VITALS — BP 97/51 | HR 58 | Temp 97.3°F | Resp 13 | Ht 66.5 in | Wt 115.0 lb

## 2018-01-01 DIAGNOSIS — R1319 Other dysphagia: Secondary | ICD-10-CM

## 2018-01-01 DIAGNOSIS — K2950 Unspecified chronic gastritis without bleeding: Secondary | ICD-10-CM | POA: Diagnosis not present

## 2018-01-01 DIAGNOSIS — K2970 Gastritis, unspecified, without bleeding: Secondary | ICD-10-CM | POA: Diagnosis not present

## 2018-01-01 DIAGNOSIS — K319 Disease of stomach and duodenum, unspecified: Secondary | ICD-10-CM

## 2018-01-01 DIAGNOSIS — K219 Gastro-esophageal reflux disease without esophagitis: Secondary | ICD-10-CM | POA: Diagnosis not present

## 2018-01-01 MED ORDER — SODIUM CHLORIDE 0.9 % IV SOLN: 500.0000 mL | Freq: Once | INTRAVENOUS | Status: DC

## 2018-01-01 NOTE — Progress Notes (Signed)
Called to room to assist during endoscopic procedure.  Patient ID and intended procedure confirmed with present staff. Received instructions for my participation in the procedure from the performing physician.  

## 2018-01-01 NOTE — Op Note (Addendum)
[facility] Patient Name: Lindsay Chang Procedure Date: 01/01/2018 2:27 PM MRN: [id number] Endoscopist: Ladene Artist , MD Age: 57 Referring MD:  Date of Birth: 04-30-1960 Gender: Female Account #: [id number] Procedure:                Upper GI endoscopy Indications:              Dysphagia Medicines:                Monitored Anesthesia Care Procedure:                Pre-Anesthesia Assessment:                           - Prior to the procedure, a History and Physical                            was performed, and patient medications and                            allergies were reviewed. The patient's tolerance of                            previous anesthesia was also reviewed. The risks                            and benefits of the procedure and the sedation                            options and risks were discussed with the patient.                            All questions were answered, and informed consent                            was obtained. Prior Anticoagulants: The patient has                            taken no previous anticoagulant or antiplatelet                            agents. ASA Grade Assessment: II - A patient with                            mild systemic disease. After reviewing the risks                            and benefits, the patient was deemed in                            satisfactory condition to undergo the procedure.                           After obtaining informed consent, the endoscope was  passed under direct vision. Throughout the                            procedure, the patient's blood pressure, pulse, and                            oxygen saturations were monitored continuously. The                            Model GIF-HQ190 [id number]) scope was introduced                            through the mouth, and advanced to the second part                            of duodenum. The upper GI endoscopy was                       accomplished without difficulty. The patient                            tolerated the procedure well. Scope In: Scope Out: Findings:                 No endoscopic abnormality was evident in the                            esophagus to explain the patient's complaint of                            dysphagia.                           Diffuse mildly erythematous mucosa without bleeding                            was found in the gastric fundus and in the gastric                            body. Biopsies were taken with a cold forceps for                            histology.                           The exam of the stomach was otherwise normal.                           The duodenal bulb and second portion of the                            duodenum were normal. Complications:            No immediate complications. Estimated Blood Loss:     Estimated blood loss was minimal. Impression:               - No endoscopic  esophageal abnormality to explain                            patient's dysphagia.                           - Erythematous mucosa in the gastric fundus and                            gastric body. Biopsied.                           - Normal duodenal bulb and second portion of the                            duodenum. Recommendation:           - Patient has a contact number available for                            emergencies. The signs and symptoms of potential                            delayed complications were discussed with the                            patient. Return to normal activities tomorrow.                            Written discharge instructions were provided to the                            patient.                           - Resume previous diet.                           - Continue present medications.                           - Await pathology results.                           - Schedule MBSS.                           - Perform  routine esophageal manometry at                            appointment to be scheduled.                           - Neurology referral - new onset liquid dysphagia,                            intermittent tremor, intermittent nystagmus Ladene Artist, MD 01/01/2018 2:42:55 PM This report has been  signed electronically.

## 2018-01-01 NOTE — Progress Notes (Signed)
Report to PACU, RN, vss, BBS= Clear.  

## 2018-01-01 NOTE — Patient Instructions (Signed)
YOU HAD AN ENDOSCOPIC PROCEDURE TODAY AT THE [facility]:   Refer to the procedure report that was given to you for any specific questions about what was found during the examination.  If the procedure report does not answer your questions, please call your gastroenterologist to clarify.  If you requested that your care partner not be given the details of your procedure findings, then the procedure report has been included in a sealed envelope for you to review at your convenience later.  YOU SHOULD EXPECT: Some feelings of bloating in the abdomen. Passage of more gas than usual.  Walking can help get rid of the air that was put into your GI tract during the procedure and reduce the bloating. If you had a lower endoscopy (such as a colonoscopy or flexible sigmoidoscopy) you may notice spotting of blood in your stool or on the toilet paper. If you underwent a bowel prep for your procedure, you may not have a normal bowel movement for a few days.  Please Note:  You might notice some irritation and congestion in your nose or some drainage.  This is from the oxygen used during your procedure.  There is no need for concern and it should clear up in a day or so.  SYMPTOMS TO REPORT IMMEDIATELY:   Following upper endoscopy (EGD)  Vomiting of blood or coffee ground material  New chest pain or pain under the shoulder blades  Painful or persistently difficult swallowing  New shortness of breath  Fever of 100F or higher  Black, tarry-looking stools  For urgent or emergent issues, a gastroenterologist can be reached at any hour by calling [phone].   DIET:  We do recommend a small meal at first, but then you may proceed to your regular diet.  Drink plenty of fluids but you should avoid alcoholic beverages for 24 hours.  ACTIVITY:  You should plan to take it easy for the rest of today and you should NOT DRIVE or use heavy machinery until tomorrow (because of the sedation medicines used  during the test).    FOLLOW UP: Our staff will call the number listed on your records the next business day following your procedure to check on you and address any questions or concerns that you may have regarding the information given to you following your procedure. If we do not reach you, we will leave a message.  However, if you are feeling well and you are not experiencing any problems, there is no need to return our call.  We will assume that you have returned to your regular daily activities without incident.  If any biopsies were taken you will be contacted by phone or by letter within the next 1-3 weeks.  Please call us at [phone] if you have not heard about the biopsies in 3 weeks.    SIGNATURES/CONFIDENTIALITY: You and/or your care partner have signed paperwork which will be entered into your electronic medical record.  These signatures attest to the fact that that the information above on your After Visit Summary has been reviewed and is understood.  Full responsibility of the confidentiality of this discharge information lies with you and/or your care-partner. 

## 2018-01-01 NOTE — Telephone Encounter (Signed)
No answer at speech path will try later    You have been scheduled for a modified barium swallow on_____  At______ . Please arrive 15 minutes prior to your test for registration. You will go to [facility] (1st Floor) for your appointment. Should you need to cancel or reschedule your appointment, please contact  [phone] [place]). ____________________________________________ A Modified Barium Swallow Study, or MBS, is a special x-ray that is taken to check swallowing skills. It is carried out by a Stage manager and a Psychologist, clinical (SLP). During this test, yourmouth, throat, and esophagus, a muscular tube which connects your mouth to your stomach, is checked. The test will help you, your doctor, and the SLP plan what types of foods and liquids are easier for you to swallow. The SLP will also identify positions and ways to help you swallow more easily and safely. What will happen during an MBS? You will be taken to an x-ray room and seated comfortably. You will be asked to swallow small amounts of food and liquid mixed with barium. Barium is a liquid or paste that allows images of your mouth, throat and esophagus to be seen on x-ray. The x-ray captures moving images of the food you are swallowing as it travels from your mouth through your throat and into your esophagus. This test helps identify whether food or liquid is entering your lungs (aspiration). The test also shows which part of your mouth or throat lacks strength or coordination to move the food or liquid in the right direction. This test typically takes 30 minutes to 1 hour to complete. ____________________________________________

## 2018-01-01 NOTE — Telephone Encounter (Signed)
Levin Erp, [place]  Timothy Lasso, RN        Dustyn Armbrister,  Can you order MBS with speech pathology. Thanks-JLL

## 2018-01-02 ENCOUNTER — Encounter: Payer: Self-pay | Admitting: Family Medicine

## 2018-01-02 ENCOUNTER — Ambulatory Visit: Payer: BLUE CROSS/BLUE SHIELD | Admitting: Psychology

## 2018-01-02 ENCOUNTER — Telehealth: Payer: Self-pay

## 2018-01-02 ENCOUNTER — Ambulatory Visit: Payer: BLUE CROSS/BLUE SHIELD

## 2018-01-02 DIAGNOSIS — F411 Generalized anxiety disorder: Secondary | ICD-10-CM

## 2018-01-02 DIAGNOSIS — R1319 Other dysphagia: Secondary | ICD-10-CM

## 2018-01-02 DIAGNOSIS — R251 Tremor, unspecified: Secondary | ICD-10-CM

## 2018-01-02 DIAGNOSIS — H5500 Unspecified nystagmus: Secondary | ICD-10-CM

## 2018-01-02 MED ORDER — ALPRAZOLAM 0.25 MG PO TABS: 0.2500 mg | ORAL_TABLET | Freq: Three times a day (TID) | ORAL | 0 refills | Status: DC | PRN

## 2018-01-02 NOTE — Telephone Encounter (Signed)
Patient notified of recommendations and agrees to proceed. She is scheduled for MBSS on 01/05/18 11:00 at [facility] .  She verbalized understanding  She is scheduled for manometry at [facility] on 01/29/18 10:30.  I will mail her instructions  She understands that she will be contacted directly by neuro for a consult appt.

## 2018-01-02 NOTE — Telephone Encounter (Signed)
01/05/18 11 am MBS pt aware

## 2018-01-02 NOTE — Telephone Encounter (Signed)
-----   Message from Ladene Artist, MD sent at 01/01/2018  2:49 PM EST ----- Please schedule the following: MBSS Esophageal manomery Neurology referral - intermittent tremors, intermittent nystagmus, new onset liquid dysphagia

## 2018-01-02 NOTE — Telephone Encounter (Signed)
  Follow up Call-  Call back number 01/01/2018  Post procedure Call Back phone  # [phone]  Permission to leave phone message Yes  Some recent data might be hidden     Patient questions:  Do you have a fever, pain , or abdominal swelling? No. Pain Score  0 *  Have you tolerated food without any problems? Yes.    Have you been able to return to your normal activities? Yes.    Do you have any questions about your discharge instructions: Diet   No. Medications  No. Follow up visit  No.  Do you have questions or concerns about your Care? No.  Actions: * If pain score is 4 or above: No action needed, pain <4.  No problems noted per pt. maw

## 2018-01-05 ENCOUNTER — Other Ambulatory Visit (HOSPITAL_COMMUNITY): Payer: Self-pay | Admitting: Gastroenterology

## 2018-01-05 ENCOUNTER — Ambulatory Visit (HOSPITAL_COMMUNITY)
Admission: RE | Admit: 2018-01-05 | Discharge: 2018-01-05 | Disposition: A | Discharge status: Home / Self Care | Payer: BLUE CROSS/BLUE SHIELD | Source: Ambulatory Visit | Attending: Gastroenterology | Admitting: Gastroenterology

## 2018-01-05 ENCOUNTER — Encounter: Payer: Self-pay | Admitting: Neurology

## 2018-01-05 DIAGNOSIS — R1314 Dysphagia, pharyngoesophageal phase: Secondary | ICD-10-CM | POA: Diagnosis not present

## 2018-01-05 DIAGNOSIS — F17210 Nicotine dependence, cigarettes, uncomplicated: Secondary | ICD-10-CM | POA: Insufficient documentation

## 2018-01-05 DIAGNOSIS — K219 Gastro-esophageal reflux disease without esophagitis: Secondary | ICD-10-CM | POA: Insufficient documentation

## 2018-01-05 DIAGNOSIS — R1312 Dysphagia, oropharyngeal phase: Secondary | ICD-10-CM | POA: Diagnosis present

## 2018-01-05 DIAGNOSIS — R1319 Other dysphagia: Secondary | ICD-10-CM

## 2018-01-05 DIAGNOSIS — R1310 Dysphagia, unspecified: Secondary | ICD-10-CM

## 2018-01-05 NOTE — Therapy (Signed)
Modified Barium Swallow Progress Note  Patient Details  Name: Lindsay Chang MRN: [id number] Date of Birth: 01-24-1961  Today's Date: 01/05/2018  Modified Barium Swallow completed.  Full report located under Chart Review in the Imaging Section.  Brief recommendations include the following:  Clinical Impression Pt presents with functional oropharyngeal swallow, with mild impairment orally and pharyngeally.   Orally, Pt exhibits piecemeal swallow across consistencies, and was unable to propel barium tablet posteriorly to swallow, despite trials of puree and thin liquid. Adequate velopharyngeal closure noted.  Pharyngeal swallow was characterized by delayed swallow reflex, with reflex trigger at the pyriform sinus on thin liquid, and at the vallecular sinus on puree and solid. There was no penetration, aspiration, or post-swallow residue on any consistency.  Recommend continuing with current diet (regular/thin). No further ST intervention recommended at this time. Results and recommendations were reviewed with pt during and after this study.    Swallow Evaluation Recommendations  SLP Diet Recommendations: Regular solids;Thin liquid   Liquid Administration via: Cup;Straw   Medication Administration: (as tolerated)   Supervision: Patient able to self feed   Compensations: Minimize environmental distractions;Slow rate;Small sips/bites   Postural Changes: Remain semi-upright after after feeds/meals (Comment);Seated upright at 90 degrees   Oral Care Recommendations: Oral care BID     Bernadine Melecio B. Quentin Ore, [facility], [place] Speech Language Pathologist [phone]  Shonna Chock 01/05/2018,12:16 PM

## 2018-01-18 NOTE — Progress Notes (Signed)
Lindsay Chang was seen today in the movement disorders clinic for neurologic consultation at the request of Ladene Artist, MD.  The consultation is for the evaluation of dysphagia, nystagumus and occasional tremor.  The records that were made available to me were reviewed.  Pt has previously seen GI and ENT for the same.  Pt reports today that dysphagia started in September.  It seemed to coincide with increased GAD.  She started to notice, she was able to drink through a straw better than without it.  If she has a big bolus of liquid, she has trouble with that.  Patient was in the emergency room on October 10 with complaints of dysphagia.  Neurology was consulted over the phone.  Acetylcholine receptor antibodies were ordered and were negative.  Patient saw Dr. Constance Holster, otolaryngology, on December 08, 2017 for trouble swallowing for several weeks.  His examination was normal.  He did feel that she was having some reflux.  He sent her for a barium esophagram which was done on December 14, 2017.  Patient was unable to swallow a 13 mm barium tablet.  There was disruption of primary peristalsis on all swallows.  Impression was "nonspecific esophageal motility disorder.  She was sent to GI, Dr. Fuller Plan, for evaluation.  He ordered a modified barium swallow which was completed on January 05, 2018.  There was functional oropharyngeal swallow with mild impairment orally and pharyngeally.  There was piecemeal swallow across consistencies and patient was unable to propel the barium tablet posteriorly to swallow.  Pharyngeal swallow was characterized by delayed swallow reflex.  She was diagnosed with oropharyngeal dysphagia and no treatment was recommended.  Regular diet with thin liquids was recommended.  She is doing the exercises and seeing a counselor for GAD and she thinks that she has gotten some better.  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  Climbs stairs/gets off of low furniture without trouble.  No  speech trouble.      Specific Symptoms:  Tremor: Yes.  , she has never noticed it but family noted it in the "no" direction Family hx of similar:  No. Voice: deepened but not weaker Sleep: sleeps well  Vivid Dreams:  No.  Acting out dreams:  No. Wet Pillows: rarely Postural symptoms:  No.  Falls?  No. Bradykinesia symptoms: no bradykinesia noted Loss of smell:  No. Loss of taste:  No. Urinary Incontinence:  No. Difficulty Swallowing:  Yes.   Handwriting, micrographia: No. Trouble with ADL's:  No.  Trouble buttoning clothing: No. Depression:  No. but has anxiety Memory changes:  No. Hallucinations:  No.  visual distortions: Yes.  , rarely N/V:  No. Lightheaded:  Occasionally , but it is not new (long hx of this due to "low" bp)  Syncope: No. Diplopia:  No. Dyskinesia:  No. Prior exposure to reglan/antipsychotics: No.  Neuroimaging of the brain has not previously been performed.    ALLERGIES:  No Known Allergies  CURRENT MEDICATIONS:  Outpatient Encounter Medications as of 01/22/2018  Medication Sig  . ALPRAZolam (XANAX) 0.25 MG tablet Take 1-2 tablets (0.25-0.5 mg total) by mouth 3 (three) times daily as needed for anxiety.  . Famotidine (PEPCID PO) Take by mouth as needed.   . Multiple Vitamin (MULITIVITAMIN WITH MINERALS) TABS Take 1 tablet by mouth daily.  . Multiple Vitamins-Minerals (HAIR SKIN AND NAILS FORMULA PO) Take 2 each by mouth daily.  . vitamin C (ASCORBIC ACID) 500 MG tablet Take 500 mg by mouth daily.  Marland Kitchen  Vitamin D-Vitamin K (K2 PLUS D3 PO) Take by mouth.  . [DISCONTINUED] cetirizine (ZYRTEC) 10 MG tablet Take 10 mg by mouth daily as needed.   . [DISCONTINUED] Cholecalciferol (VITAMIN D) 2000 UNITS tablet Take 2,000 Units by mouth daily.  . [DISCONTINUED] ibuprofen (ADVIL,MOTRIN) 200 MG tablet Take 200-600 mg by mouth every 6 (six) hours as needed. For headache or pain  . [DISCONTINUED] Magnesium Citrate POWD as needed.    No facility-administered  encounter medications on file as of 01/22/2018.     PAST MEDICAL HISTORY:   Past Medical History:  Diagnosis Date  . Allergy   . Anxiety   . B12 deficiency   . Colon polyp, hyperplastic 06/2012  . Esophageal dysmotility   . Rosacea    Dr. Delman Cheadle    PAST SURGICAL HISTORY:   Past Surgical History:  Procedure Laterality Date  . ANAL FISSURECTOMY  2002  . COLONOSCOPY  06/2012   Dr. Fuller Plan    SOCIAL HISTORY:   Social History   Socioeconomic History  . Marital status: Married    Spouse name: Not on file  . Number of children: 2  . Years of education: Not on file  . Highest education level: Not on file  Occupational History  . Occupation: Yoga instructor  Social Needs  . Financial resource strain: Not on file  . Food insecurity:    Worry: Not on file    Inability: Not on file  . Transportation needs:    Medical: Not on file    Non-medical: Not on file  Tobacco Use  . Smoking status: Current Every Day Smoker    Packs/day: 0.75    Types: Cigarettes  . Smokeless tobacco: Never Used  Substance and Sexual Activity  . Alcohol use: Yes    Alcohol/week: 3.0 - 4.0 standard drinks    Types: 3 - 4 Glasses of wine per week  . Drug use: No  . Sexual activity: Yes    Birth control/protection: Other-see comments    Comment: husband with vasectomy  Lifestyle  . Physical activity:    Days per week: Not on file    Minutes per session: Not on file  . Stress: Not on file  Relationships  . Social connections:    Talks on phone: Not on file    Gets together: Not on file    Attends religious service: Not on file    Active member of club or organization: Not on file    Attends meetings of clubs or organizations: Not on file    Relationship status: Not on file  . Intimate partner violence:    Fear of current or ex partner: Not on file    Emotionally abused: Not on file    Physically abused: Not on file    Forced sexual activity: Not on file  Other Topics Concern  . Not on file    Social History Narrative   Verta Ellen daughter. 2 sons (both living at home, graduated from [employer], working; interested in [place], Therapist, nutritional).     Husband also smokes. Husband is type 1 diabetic   1 dog, 1 cat    FAMILY HISTORY:   Family Status  Relation Name Status  . Mother  Alive  . Father  Alive  . Brother  Alive       pituitary gland disease  . MGM  Deceased  . MGF  Deceased  . Son  Alive  . Son  Alive  . Neg Hx  (Not  Specified)    ROS:  ROS  PHYSICAL EXAMINATION:    VITALS:   Vitals:   01/22/18 1014  BP: 114/70  Pulse: 76  SpO2: 98%  Weight: 115 lb (52.2 kg)  Height: 5' 6.75" (1.695 m)   Pt undressed and placed into examining shorts for the examination.  GEN:  The patient appears stated age and is in NAD. HEENT:  Normocephalic, atraumatic.  The mucous membranes are moist. The superficial temporal arteries are without ropiness or tenderness. CV:  RRR Lungs:  CTAB Neck/HEME:  There are no carotid bruits bilaterally.  When head is turned to the left and neck slightly flexed, head tremor is noted.  No hypertrophy of muscles is noted.    Neurological examination:  Orientation: The patient is alert and oriented x3. Fund of knowledge is appropriate.  Recent and remote memory are intact.  Attention and concentration are normal.    Able to name objects and repeat phrases. Cranial nerves: There is good facial symmetry. Pupils are equal round and reactive to light bilaterally. Fundoscopic exam reveals clear margins bilaterally. Extraocular muscles are intact.  She is able to sustain prolonged upgaze without trouble.  The visual fields are full to confrontational testing. The speech is fluent and clear. Soft palate rises symmetrically and there is no tongue deviation. Hearing is intact to conversational tone. Sensation: Sensation is intact to light and pinprick throughout (facial, trunk, extremities). Vibration is decreased at the bilateral big toe and ankle and  intact at the knee. There is no extinction with double simultaneous stimulation. There is no sensory dermatomal level identified. Motor: Strength is 5/5 in the bilateral upper and lower extremities, both proximally and distally.   Shoulder shrug is equal and symmetric.  There is no pronator drift. Deep tendon reflexes: Deep tendon reflexes are 2/4 at the bilateral biceps, triceps, brachioradialis, patella and achilles. Plantar responses are downgoing bilaterally.  Movement examination: Tone: There is normal tone in the bilateral upper extremities.  The tone in the lower extremities is normal.  Abnormal movements: head tremor in the "no" direction is noted, as stated above Coordination:  There is no decremation with RAM's, with any form of RAMS, including alternating supination and pronation of the forearm, hand opening and closing, finger taps, heel taps and toe taps. Gait and Station: The patient has no difficulty arising out of a deep-seated chair without the use of the hands. The patient's stride length is normal.    Lab Results  Component Value Date   VITAMINB12 671 11/20/2017   Lab Results  Component Value Date   TSH 1.880 11/20/2017     Chemistry      Component Value Date/Time   NA 142 12/07/2017 2020   NA 140 11/20/2017 1131   K 4.0 12/07/2017 2020   CL 108 12/07/2017 2020   CO2 26 12/07/2017 2020   BUN 6 12/07/2017 2020   BUN 10 11/20/2017 1131   CREATININE 0.66 12/07/2017 2020   CREATININE 0.69 09/28/2016 1356      Component Value Date/Time   CALCIUM 9.6 12/07/2017 2020   ALKPHOS 68 11/20/2017 1131   AST 12 11/20/2017 1131   ALT 9 11/20/2017 1131   BILITOT 0.4 11/20/2017 1131       ASSESSMENT/PLAN:  1.  Dysphagia  -Her neuro exam is nonfocal and nonlateralizing.  She has had negative acetylcholine receptor antibodies.  My suspicion for a primary neurologic etiology is low, but we will continue the work-up.  We will do an EMG  with myasthenia protocol.  -we will do  MRI brain  2.  Cervical Dystonia  -I talked to the patient about the nature and pathophysiology.  The patient is having trouble with ADL's and with rotation of the head in daily life for driving.  The primary muscles involved are the right SCM, left splenius capitis>R [place].  We talked about treatments.  We talked about the value of botox.  Ultimately, I told her I would not recommend this for her right now partially because her dystonia is so mild and also because of the fact that she is already having dysphagia and this is one of the potential side effects of Botox.  She was given patient literature on this.   -do MRI cervical spine  3.  Generalized anxiety disorder  -Patient is going to counseling.  She is open to the fact that this could contribute to dysphagia, but I told her we should explore all etiologies before stating that this is anxiety.  4.  Follow up will depend on results of above    Cc:  Rita Ohara, MD

## 2018-01-22 ENCOUNTER — Ambulatory Visit: Payer: BLUE CROSS/BLUE SHIELD | Admitting: Neurology

## 2018-01-22 ENCOUNTER — Other Ambulatory Visit (INDEPENDENT_AMBULATORY_CARE_PROVIDER_SITE_OTHER): Payer: BLUE CROSS/BLUE SHIELD

## 2018-01-22 ENCOUNTER — Encounter: Payer: Self-pay | Admitting: Neurology

## 2018-01-22 VITALS — BP 114/70 | HR 76 | Ht 66.75 in | Wt 115.0 lb

## 2018-01-22 DIAGNOSIS — D510 Vitamin B12 deficiency anemia due to intrinsic factor deficiency: Secondary | ICD-10-CM | POA: Diagnosis not present

## 2018-01-22 DIAGNOSIS — G243 Spasmodic torticollis: Secondary | ICD-10-CM

## 2018-01-22 DIAGNOSIS — F411 Generalized anxiety disorder: Secondary | ICD-10-CM | POA: Diagnosis not present

## 2018-01-22 DIAGNOSIS — R1310 Dysphagia, unspecified: Secondary | ICD-10-CM

## 2018-01-22 MED ORDER — CYANOCOBALAMIN 1000 MCG/ML IJ SOLN
1000.0000 ug | Freq: Once | INTRAMUSCULAR | Status: AC
  Administered 2018-01-22: 1000 ug via INTRAMUSCULAR

## 2018-01-22 NOTE — Addendum Note (Signed)
Addended byAnnamaria Helling on: 01/22/2018 11:33 AM   Modules accepted: Orders

## 2018-01-22 NOTE — Patient Instructions (Addendum)
We have sent a referral to [facility] for your MRI and they will call you directly to schedule your appt. They are located at [place]. If you need to contact them directly please call [phone].    ELECTROMYOGRAM AND NERVE CONDUCTION STUDIES (EMG/NCS) INSTRUCTIONS  How to Prepare The neurologist conducting the EMG will need to know if you have certain medical conditions. Tell the neurologist and other EMG lab personnel if you: . Have a pacemaker or any other electrical medical device . Take blood-thinning medications . Have hemophilia, a blood-clotting disorder that causes prolonged bleeding Bathing Take a shower or bath shortly before your exam in order to remove oils from your skin. Don't apply lotions or creams before the exam.  What to Expect You'll likely be asked to change into a hospital gown for the procedure and lie down on an examination table. The following explanations can help you understand what will happen during the exam.  . Electrodes. The neurologist or a technician places surface electrodes at various locations on your skin depending on where you're experiencing symptoms. Or the neurologist may insert needle electrodes at different sites depending on your symptoms.  . Sensations. The electrodes will at times transmit a tiny electrical current that you may feel as a twinge or spasm. The needle electrode may cause discomfort or pain that usually ends shortly after the needle is removed. If you are concerned about discomfort or pain, you may want to talk to the neurologist about taking a short break during the exam.  . Instructions. During the needle EMG, the neurologist will assess whether there is any spontaneous electrical activity when the muscle is at rest - activity that isn't present in healthy muscle tissue - and the degree of activity when you slightly contract the muscle.  He or she will give you instructions on resting and contracting a muscle at  appropriate times. Depending on what muscles and nerves the neurologist is examining, he or she may ask you to change positions during the exam.  After your EMG You may experience some temporary, minor bruising where the needle electrode was inserted into your muscle. This bruising should fade within several days. If it persists, contact your primary care doctor.

## 2018-01-23 ENCOUNTER — Ambulatory Visit: Payer: BLUE CROSS/BLUE SHIELD | Admitting: Psychology

## 2018-01-23 ENCOUNTER — Encounter: Payer: Self-pay | Admitting: Gastroenterology

## 2018-01-23 ENCOUNTER — Ambulatory Visit (INDEPENDENT_AMBULATORY_CARE_PROVIDER_SITE_OTHER): Payer: BLUE CROSS/BLUE SHIELD | Admitting: Neurology

## 2018-01-23 ENCOUNTER — Telehealth: Payer: Self-pay | Admitting: Gastroenterology

## 2018-01-23 DIAGNOSIS — R1310 Dysphagia, unspecified: Secondary | ICD-10-CM

## 2018-01-23 NOTE — Telephone Encounter (Signed)
I have cancelled the esophageal manometry as requested.

## 2018-01-23 NOTE — Procedures (Signed)
[facility]  [address]  [place], [place] [address] Tel: [phone] Fax:  [phone] Test Date:  01/23/2018  Patient: Lindsay Chang DOB: April 16, 1960 Physician: Narda Amber, DO  Sex: Female Height: 5\' 6"  Ref Phys: Alonza Bogus, DO  ID#: [id number] Temp: 35.0C Technician:    Patient Complaints: This is a 57 year old female referred for evaluation of intermittent dysphasia to liquids only.  NCV & EMG Findings: Extensive electrodiagnostic testing of the right upper and lower extremities and repetitive nerve stimulation shows:  1. Right median, sural, and superficial peroneal nerves are within normal limits. 2. Right median, peroneal, and tibial motor responses are within normal limits. 3. Repetitive nerve stimulation of the spinal accessory, median, and peroneal nerve recording at the trapezius, abductor pollicis brevis, and anterior tibialis muscles, respectively, is normal.  Specifically, there is no evidence of abnormal decrement. 4. There is no evidence of active or chronic motor axonal loss changes affecting any of the tested muscles.  Motor unit configuration and recruitment pattern is within normal limits.    Impression: This is a normal study of the right arm and leg.    In particular, there is no evidence of a neuromuscular junction disorder, diffuse myopathy, cervical/lumbosacral radiculopathy, or sensorimotor polyneuropathy affecting the right side.   ___________________________ Narda Amber, DO    Nerve Conduction Studies Anti Sensory Summary Table   Site NR Peak (ms) Norm Peak (ms) P-T Amp (V) Norm P-T Amp  Right Median Anti Sensory (2nd Digit)  35C  Wrist    3.1 <3.6 31.9 >15  Right Sup Peroneal Anti Sensory (Ant Lat Mall)  35C  12 cm    2.5 <4.6 9.9 >4  Right Sural Anti Sensory (Lat Mall)  35C  Calf    3.4 <4.6 8.6 >4   Motor Summary Table   Site NR Onset (ms) Norm Onset (ms) O-P Amp (mV) Norm O-P Amp Site1 Site2 Delta-0 (ms) Dist  (cm) Vel (m/s) Norm Vel (m/s)  Right Median Motor (Abd Poll Brev)  35C  Wrist    2.6 <4.0 12.9 >6 Elbow Wrist 4.5 27.0 60 >50  Elbow    7.1  12.8         Right Peroneal Motor (Ext Dig Brev)  35C  Ankle    2.5 <6.0 7.2 >2.5 B Fib Ankle 7.7 38.0 49 >40  B Fib    10.2  7.0  Poplt B Fib 1.4 8.0 57 >40  Poplt    11.6  6.8         Right Peroneal TA Motor (Tib Ant)  35C  Fib Head    3.1 <4.5 5.1 >3 Poplit Fib Head 1.1 8.0 73 >40  Poplit    4.2  5.0         Right Tibial Motor (Abd Hall Brev)  35C  Ankle    4.6 <6.0 14.4 >4 Knee Ankle 6.8 41.0 60 >40  Knee    11.4  9.3          EMG   Side Muscle Ins Act Fibs Psw Fasc Number Recrt Dur Dur. Amp Amp. Poly Poly. Comment  Right AntTibialis Nml Nml Nml Nml Nml Nml Nml Nml Nml Nml Nml Nml N/A  Right RectFemoris Nml Nml Nml Nml Nml Nml Nml Nml Nml Nml Nml Nml N/A  Right BicepsFemS Nml Nml Nml Nml Nml Nml Nml Nml Nml Nml Nml Nml N/A  Right GluteusMed Nml Nml Nml Nml Nml Nml Nml Nml Nml Nml Nml Nml  N/A  Right Gastroc Nml Nml Nml Nml Nml Nml Nml Nml Nml Nml Nml Nml N/A  Right 1stDorInt Nml Nml Nml Nml Nml Nml Nml Nml Nml Nml Nml Nml N/A  Right PronatorTeres Nml Nml Nml Nml Nml Nml Nml Nml Nml Nml Nml Nml N/A  Right Biceps Nml Nml Nml Nml Nml Nml Nml Nml Nml Nml Nml Nml N/A  Right Triceps Nml Nml Nml Nml Nml Nml Nml Nml Nml Nml Nml Nml N/A   RNS   Trial # Label Amp 1 (mV)  O-P Amp 5 (mV)  O-P Amp % Dif Area 1 (mVms) Area 5 (mVms) Area % Dif Rep Rate Train Length Pause Time (min:sec) Comments  Right Abd Poll Brev - Run #1  Tr 1 Baseline 12.84 13.03 1.5 42.95 40.67 -5.3 3.00 10 00:30   Tr 2 Post Exercise 13.86 13.99 0.9 41.78 40.33 -3.5 3.00 10 01:00   Tr 3 1 Min Post 13.48 14.28 6.0 42.84 40.25 -6.1 3.00 10 01:00   Tr 4 2 Min Post 13.70 13.83 0.9 43.58 41.67 -4.4 3.00 10 01:00   Right Trapezius  Tr 3 Baseline 3.60 3.44 -4.4 26.74 24.11 -9.8 3.00 10 00:30   Tr 4 Post Exercise 3.56 3.64 2.0 27.44 25.70 -6.3 3.00 10 01:00   Tr 5 1 Min Post 3.67  3.71 1.1 25.63 23.97 -6.5 3.00 10 01:00   Tr 8 2 Min Post 3.76 3.54 -5.9 23.31 21.06 -9.6 3.00 10 01:00   Tr 9 3 Min Post 3.67 3.76 2.6 22.12 21.26 -3.9 3.00 10 00:00   Right AntTibialis  Tr 1 Baseline 4.66 4.63 -0.7 34.91 32.62 -6.6 3.00 10 00:30   Tr 3 Post Exercise 4.77 4.60 -3.5 33.61 31.08 -7.5 3.00 10 01:00   Tr 4 1 Min Post 4.75 4.62 -2.7 34.09 32.13 -5.8 3.00 10 01:00   Tr 5 2 Min Post 4.69 4.54 -3.2 33.07 31.17 -5.7 3.00 10 01:00   Tr 6 3 Min Post 4.70 4.56 -3.0 33.30 31.31 -6.0 3.00 10 00:00      Waveforms:

## 2018-01-24 ENCOUNTER — Telehealth: Payer: Self-pay | Admitting: Neurology

## 2018-01-24 NOTE — Telephone Encounter (Signed)
Mychart message sent to patient.

## 2018-01-24 NOTE — Telephone Encounter (Signed)
-----   Message from Alda Berthold, DO sent at 01/24/2018 10:48 AM EST ----- Please inform patient that her nerve testing is normal.  No signs of neuromuscular disorder causing her swallowing difficulty. We will let her know of her MRI results, once this has been completed. Thanks.

## 2018-01-29 ENCOUNTER — Encounter (HOSPITAL_COMMUNITY): Admission: RE | Payer: Self-pay | Source: Ambulatory Visit

## 2018-01-29 ENCOUNTER — Ambulatory Visit (HOSPITAL_COMMUNITY)
Admission: RE | Admit: 2018-01-29 | Payer: BLUE CROSS/BLUE SHIELD | Source: Ambulatory Visit | Admitting: Gastroenterology

## 2018-01-29 SURGERY — MANOMETRY, ESOPHAGUS

## 2018-01-30 ENCOUNTER — Ambulatory Visit: Payer: BLUE CROSS/BLUE SHIELD | Admitting: Psychology

## 2018-01-30 DIAGNOSIS — F411 Generalized anxiety disorder: Secondary | ICD-10-CM

## 2018-02-01 ENCOUNTER — Ambulatory Visit
Admission: RE | Admit: 2018-02-01 | Discharge: 2018-02-01 | Disposition: A | Discharge status: Home / Self Care | Payer: BLUE CROSS/BLUE SHIELD | Source: Ambulatory Visit | Attending: Neurology | Admitting: Neurology

## 2018-02-01 DIAGNOSIS — R1310 Dysphagia, unspecified: Secondary | ICD-10-CM

## 2018-02-01 DIAGNOSIS — G243 Spasmodic torticollis: Secondary | ICD-10-CM

## 2018-02-02 ENCOUNTER — Telehealth: Payer: Self-pay | Admitting: Neurology

## 2018-02-02 DIAGNOSIS — R200 Anesthesia of skin: Secondary | ICD-10-CM | POA: Diagnosis not present

## 2018-02-02 DIAGNOSIS — R251 Tremor, unspecified: Secondary | ICD-10-CM | POA: Diagnosis not present

## 2018-02-02 DIAGNOSIS — M50222 Other cervical disc displacement at C5-C6 level: Secondary | ICD-10-CM | POA: Diagnosis not present

## 2018-02-02 NOTE — Telephone Encounter (Signed)
Mychart message sent to patient.

## 2018-02-02 NOTE — Telephone Encounter (Signed)
-----   Message from Merrillan, DO sent at 02/02/2018  1:27 PM EST ----- Let pt know that MRI brain and c-spine looks good and normal.  At this point, I don't see neuro reason for swallow trouble

## 2018-02-15 ENCOUNTER — Other Ambulatory Visit (INDEPENDENT_AMBULATORY_CARE_PROVIDER_SITE_OTHER): Payer: BLUE CROSS/BLUE SHIELD

## 2018-02-15 DIAGNOSIS — D510 Vitamin B12 deficiency anemia due to intrinsic factor deficiency: Secondary | ICD-10-CM | POA: Diagnosis not present

## 2018-02-15 MED ORDER — CYANOCOBALAMIN 1000 MCG/ML IJ SOLN
1000.0000 ug | Freq: Once | INTRAMUSCULAR | Status: AC
  Administered 2018-02-15: 1000 ug via INTRAMUSCULAR

## 2018-03-06 ENCOUNTER — Ambulatory Visit: Payer: BLUE CROSS/BLUE SHIELD | Admitting: Psychology

## 2018-03-06 DIAGNOSIS — F411 Generalized anxiety disorder: Secondary | ICD-10-CM

## 2018-03-15 ENCOUNTER — Other Ambulatory Visit (INDEPENDENT_AMBULATORY_CARE_PROVIDER_SITE_OTHER): Payer: BLUE CROSS/BLUE SHIELD

## 2018-03-15 DIAGNOSIS — D510 Vitamin B12 deficiency anemia due to intrinsic factor deficiency: Secondary | ICD-10-CM | POA: Diagnosis not present

## 2018-03-15 MED ORDER — CYANOCOBALAMIN 1000 MCG/ML IJ SOLN
1000.0000 ug | Freq: Once | INTRAMUSCULAR | Status: AC
  Administered 2018-03-15: 1000 ug via INTRAMUSCULAR

## 2018-04-16 ENCOUNTER — Other Ambulatory Visit (INDEPENDENT_AMBULATORY_CARE_PROVIDER_SITE_OTHER): Payer: BLUE CROSS/BLUE SHIELD

## 2018-04-16 DIAGNOSIS — D649 Anemia, unspecified: Secondary | ICD-10-CM | POA: Diagnosis not present

## 2018-04-16 MED ORDER — CYANOCOBALAMIN 1000 MCG/ML IJ SOLN
1000.0000 ug | Freq: Once | INTRAMUSCULAR | Status: AC
  Administered 2018-04-16: 1000 ug via INTRAMUSCULAR

## 2018-05-14 ENCOUNTER — Other Ambulatory Visit: Payer: Self-pay

## 2018-05-15 ENCOUNTER — Other Ambulatory Visit: Payer: Self-pay | Admitting: Family Medicine

## 2018-05-15 ENCOUNTER — Encounter: Payer: Self-pay | Admitting: Family Medicine

## 2018-05-15 DIAGNOSIS — F411 Generalized anxiety disorder: Secondary | ICD-10-CM

## 2018-05-15 MED ORDER — ALPRAZOLAM 0.25 MG PO TABS: 0.2500 mg | ORAL_TABLET | Freq: Three times a day (TID) | ORAL | 0 refills | Status: AC | PRN

## 2018-05-16 ENCOUNTER — Other Ambulatory Visit: Payer: Self-pay | Admitting: *Deleted

## 2018-05-16 ENCOUNTER — Telehealth: Payer: Self-pay | Admitting: Family Medicine

## 2018-05-16 MED ORDER — MUPIROCIN CALCIUM 2 % EX CREA: TOPICAL_CREAM | CUTANEOUS | 0 refills | Status: DC

## 2018-05-16 MED ORDER — CYANOCOBALAMIN 1000 MCG/ML IJ SOLN: INTRAMUSCULAR | 0 refills | Status: DC

## 2018-05-16 NOTE — Telephone Encounter (Signed)
PHARMACY  Sent in drug change request her insurance will not cover the mupirocin 2% they are wanted it changed to the Mupirocin 2% ointment pt uses [facility], [place] [address]

## 2018-05-16 NOTE — Telephone Encounter (Signed)
Ok to change

## 2018-05-16 NOTE — Telephone Encounter (Signed)
Done

## 2018-05-17 ENCOUNTER — Other Ambulatory Visit: Payer: Self-pay

## 2018-05-19 ENCOUNTER — Telehealth: Payer: Self-pay | Admitting: Family Medicine

## 2018-05-19 NOTE — Telephone Encounter (Signed)
P.A. MUPIROCIN CREAM not done, pt was switched to covered ointment

## 2018-06-14 ENCOUNTER — Ambulatory Visit (INDEPENDENT_AMBULATORY_CARE_PROVIDER_SITE_OTHER): Payer: BLUE CROSS/BLUE SHIELD | Admitting: Family Medicine

## 2018-06-14 ENCOUNTER — Encounter: Payer: Self-pay | Admitting: Family Medicine

## 2018-06-14 ENCOUNTER — Other Ambulatory Visit: Payer: Self-pay

## 2018-06-14 VITALS — BP 105/70 | HR 81 | Temp 96.8°F | Ht 66.75 in | Wt 119.0 lb

## 2018-06-14 DIAGNOSIS — H9191 Unspecified hearing loss, right ear: Secondary | ICD-10-CM | POA: Diagnosis not present

## 2018-06-14 NOTE — Patient Instructions (Signed)
I suspect that your ear plugging may be related to some dry blood (+/- wax) in the ear canal.  There may also be contribution of eustachian tube dysfunction related to allergies, which can also contribute to plugging and popping in the ears.  Consider trial of ear lavage (using leukwarm water), as well as use of sudafed.  If your symptoms aren't improving, return next week for in-office visit for evaluation of your ear.  Contact us if you develop ear pain, fever, purulent drainage (discolored, foul smelling), which could indicate infection.

## 2018-06-14 NOTE — Progress Notes (Signed)
Start time: 11:35 End time: 11:48  Virtual Visit via Video Note  I connected with Lindsay Chang on 06/14/18 at 11:30 AM EDT by a video enabled telemedicine application and verified that I am speaking with the correct person using two identifiers.   I discussed the limitations of evaluation and management by telemedicine and the availability of in person appointments. The patient expressed understanding and agreed to proceed.  History of Present Illness:  Chief Complaint  Patient presents with  . Ear Fullness    right ear fullness, some pain with swallowing. Had some slight bloody crusting this am when she woke up this morning. Does wear an earpiece in that right ear and was listening to some music in that ear last night.    She was fine yesterday. Allergies are well controlled. She woke up this morning with muffled hearing out of the right ear. She could hear husband whispering. Feels like she needs to pop her ears and she cannot. Slight discomfort with swallowing. Sometimes hears slight popping when talking.  Husband noted some scabbed/dried blood in the right ear. No active bleeding.  She denies fever, chills, ear pain, other ear drainage. Denies any swollen glands.  PMH, PSH, SH reviewed  Outpatient Encounter Medications as of 06/14/2018  Medication Sig Note  . ALPRAZolam (XANAX) 0.25 MG tablet Take 1-2 tablets (0.25-0.5 mg total) by mouth 3 (three) times daily as needed for anxiety.   . cyanocobalamin (,VITAMIN B-12,) 1000 MCG/ML injection Injection 48ml monthly   . Famotidine (PEPCID PO) Take by mouth as needed.  11/14/2016: Uses prn, about once a week or less  . Multiple Vitamin (MULITIVITAMIN WITH MINERALS) TABS Take 1 tablet by mouth daily.   . Multiple Vitamins-Minerals (HAIR SKIN AND NAILS FORMULA PO) Take 2 each by mouth daily.   . mupirocin ointment (BACTROBAN) 2 %    . vitamin C (ASCORBIC ACID) 500 MG tablet Take 500 mg by mouth daily.   . Vitamin D-Vitamin K (K2 PLUS D3  PO) Take by mouth. 11/20/2017: 5000iu vit d and 540mcg vit K2 110mcg mk7  . [DISCONTINUED] mupirocin cream (BACTROBAN) 2 % Apply to affected areas of skin twice daily as needed for skin infections    No facility-administered encounter medications on file as of 06/14/2018.    No Known Allergies  ROS: no fever, chills, URI symptoms, headaches, cough, shortness of breath, GI complaints. Right ear symptoms per HPI.    Observations/Objective:  BP 105/70   Pulse 81   Temp (!) 96.8 F (36 C) (Oral)   Ht 5' 6.75" (1.695 m)   Wt 119 lb (54 kg)   LMP 10/10/2011   BMI 18.78 kg/m   Exam limited due to virtual nature of the visit. She appears, alert, oriented, and in no distress. She has no tenderness in touch/moving the ear (anterior to ear or moving the external ear).  No visible abnormality noted, no swelling Neck: no visible masses. Cranial nerves grossly intact.  Assessment and Plan:  Decreased hearing, right - suspect related to trauma from earbud, with dried blood +/- cerumen in canal. Ddx includes ETD from allergies also  Contact us if s/sx if infection develop; f/u in office if symptoms persist/worsen    Follow Up Instructions:    I discussed the assessment and treatment plan with the patient. The patient was provided an opportunity to ask questions and all were answered. The patient agreed with the plan and demonstrated an understanding of the instructions.   The patient was  advised to call back or seek an in-person evaluation if the symptoms worsen or if the condition fails to improve as anticipated.  I provided 13 minutes of non-face-to-face time during this encounter.   Vikki Ports, MD

## 2018-08-14 DIAGNOSIS — Z681 Body mass index (BMI) 19 or less, adult: Secondary | ICD-10-CM | POA: Diagnosis not present

## 2018-08-14 DIAGNOSIS — Z1151 Encounter for screening for human papillomavirus (HPV): Secondary | ICD-10-CM | POA: Diagnosis not present

## 2018-08-14 DIAGNOSIS — Z124 Encounter for screening for malignant neoplasm of cervix: Secondary | ICD-10-CM | POA: Diagnosis not present

## 2018-08-14 DIAGNOSIS — Z01419 Encounter for gynecological examination (general) (routine) without abnormal findings: Secondary | ICD-10-CM | POA: Diagnosis not present

## 2018-08-14 DIAGNOSIS — Z113 Encounter for screening for infections with a predominantly sexual mode of transmission: Secondary | ICD-10-CM | POA: Diagnosis not present

## 2018-09-03 DIAGNOSIS — Z1331 Encounter for screening for depression: Secondary | ICD-10-CM | POA: Diagnosis not present

## 2018-09-03 DIAGNOSIS — H66002 Acute suppurative otitis media without spontaneous rupture of ear drum, left ear: Secondary | ICD-10-CM | POA: Diagnosis not present

## 2018-09-03 DIAGNOSIS — H6122 Impacted cerumen, left ear: Secondary | ICD-10-CM | POA: Diagnosis not present

## 2018-09-12 ENCOUNTER — Telehealth: Payer: Self-pay | Admitting: Family Medicine

## 2018-09-13 ENCOUNTER — Other Ambulatory Visit: Payer: Self-pay | Admitting: *Deleted

## 2018-09-13 MED ORDER — CYANOCOBALAMIN 1000 MCG/ML IJ SOLN: INTRAMUSCULAR | 1 refills | Status: AC

## 2018-11-28 ENCOUNTER — Encounter: Payer: BLUE CROSS/BLUE SHIELD | Admitting: Family Medicine

## 2019-01-07 DIAGNOSIS — E538 Deficiency of other specified B group vitamins: Secondary | ICD-10-CM | POA: Diagnosis not present

## 2019-01-07 DIAGNOSIS — Z1231 Encounter for screening mammogram for malignant neoplasm of breast: Secondary | ICD-10-CM | POA: Diagnosis not present

## 2019-01-07 DIAGNOSIS — D229 Melanocytic nevi, unspecified: Secondary | ICD-10-CM | POA: Diagnosis not present

## 2019-01-07 DIAGNOSIS — Z681 Body mass index (BMI) 19 or less, adult: Secondary | ICD-10-CM | POA: Diagnosis not present

## 2019-01-14 DIAGNOSIS — L814 Other melanin hyperpigmentation: Secondary | ICD-10-CM | POA: Diagnosis not present

## 2019-01-14 DIAGNOSIS — D225 Melanocytic nevi of trunk: Secondary | ICD-10-CM | POA: Diagnosis not present

## 2019-01-14 DIAGNOSIS — L818 Other specified disorders of pigmentation: Secondary | ICD-10-CM | POA: Diagnosis not present

## 2019-01-14 DIAGNOSIS — L821 Other seborrheic keratosis: Secondary | ICD-10-CM | POA: Diagnosis not present

## 2019-02-03 IMAGING — MR MR CERVICAL SPINE W/O CM
15 series · 48 of 48 positions shown · non-contrast
Comparison: None.

CLINICAL DATA: Dysphagia. Cervical dystonia. Head tremors. Numbness
in the great toes.

EXAM:
MRI HEAD WITHOUT CONTRAST
MRI CERVICAL SPINE WITHOUT CONTRAST
TECHNIQUE: Multiplanar, multiecho pulse sequences of the brain and surrounding
structures, and cervical spine, to include the craniocervical
junction and cervicothoracic junction, were obtained without
intravenous contrast.

[Series 2: t1_se_sag · sagittal · 5.0mm · 0.45mm/px · 2 of 21 slices shown]
[im 1/21]
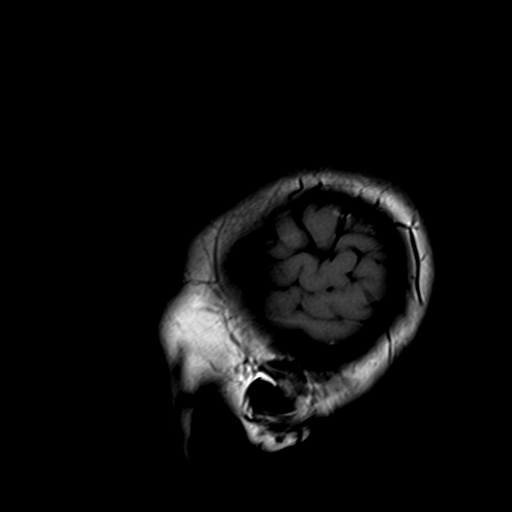
[im 21/21]
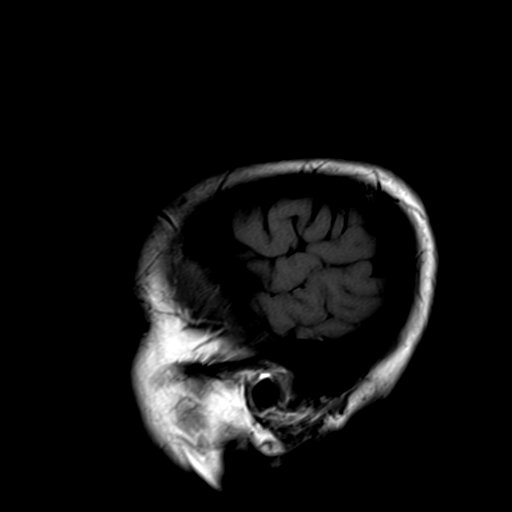

[Series 3: t2_tse_tra_512 · axial · 5.0mm · 0.60mm/px · z∈[-44,+99]mm · 2 of 24 slices shown]
[im 1/24]
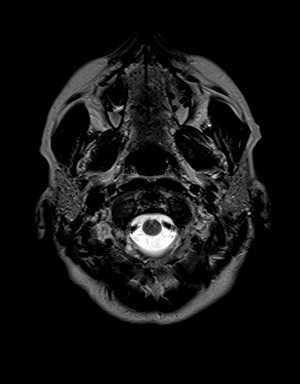
[im 24/24]
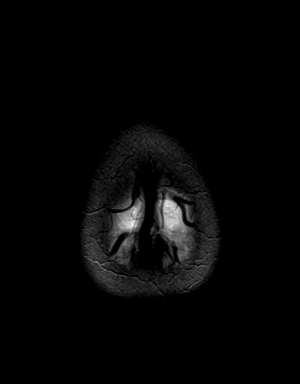

[Series 4: ep2d_diff_(id)_trace · axial · 3.0mm · 1.80mm/px · z∈[-43,+98]mm · 8 of 96 slices shown]
[im 1/96]
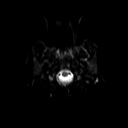
[im 14/96]
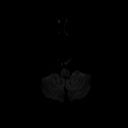
[im 28/96]
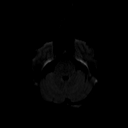
[im 41/96]
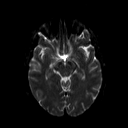
[im 55/96]
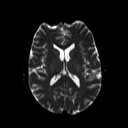
[im 68/96]
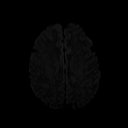
[im 82/96]
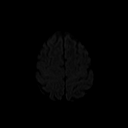
[im 96/96]
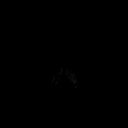

[Series 5: ep2d_diff_(id)_trace_adc · axial · 3.0mm · 1.80mm/px · z∈[-43,+98]mm · 4 of 48 slices shown]
[im 1/48]
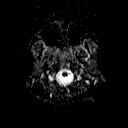
[im 16/48]
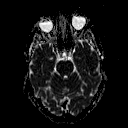
[im 32/48]
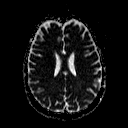
[im 48/48]
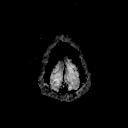

[Series 6: ep2d_diff_cor · coronal · 5.0mm · 1.77mm/px · 4 of 50 slices shown]
[im 1/50]
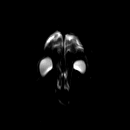
[im 17/50]
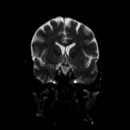
[im 33/50]
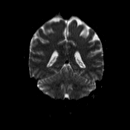
[im 50/50]
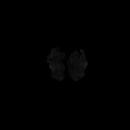

[Series 7: ep2d_diff_cor_adc · coronal · 5.0mm · 1.77mm/px · 2 of 25 slices shown]
[im 1/25]
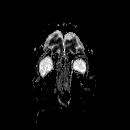
[im 25/25]
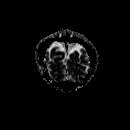

[Series 9: swi_images · axial · 4.0mm · 0.90mm/px · z∈[-50,+105]mm · 3 of 40 slices shown]
[im 1/40]
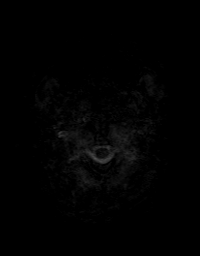
[im 20/40]
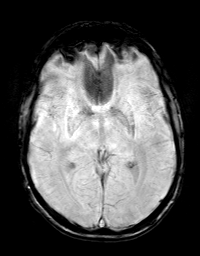
[im 40/40]
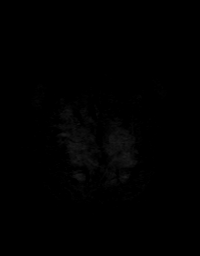

[Series 10: FLAIR · axial · 3.0mm · 0.43mm/px · z∈[-47,+101]mm · 2 of 30 slices shown]
[im 1/30]
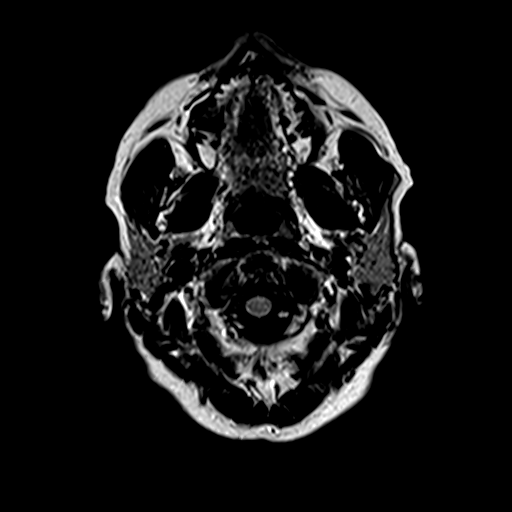
[im 30/30]
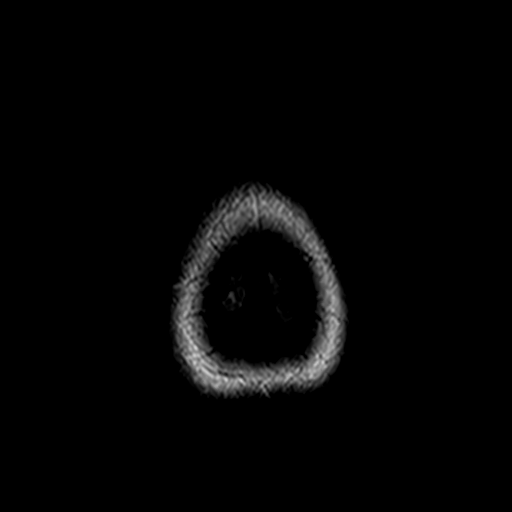

[Series 11: t1_mpr_tra · axial · 1.1mm · 0.72mm/px · z∈[-51,+106]mm · 12 of 144 slices shown]
[im 1/144]
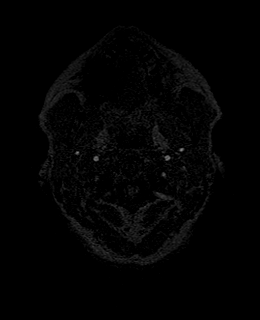
[im 14/144]
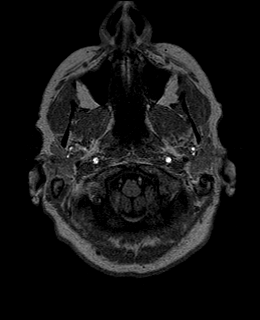
[im 27/144]
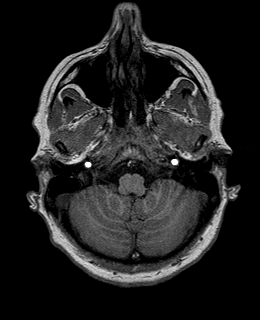
[im 40/144]
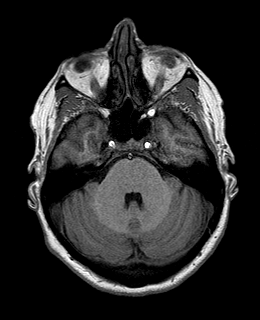
[im 53/144]
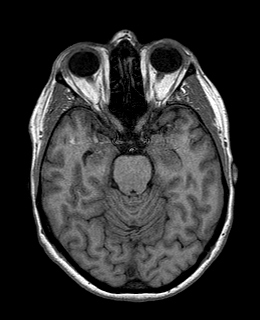
[im 66/144]
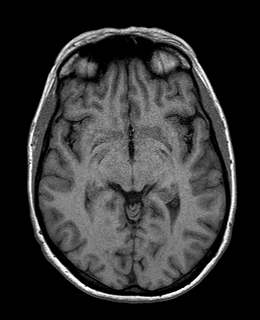
[im 79/144]
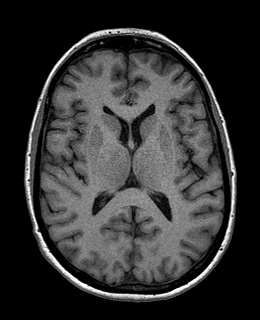
[im 92/144]
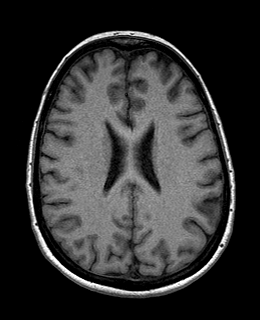
[im 105/144]
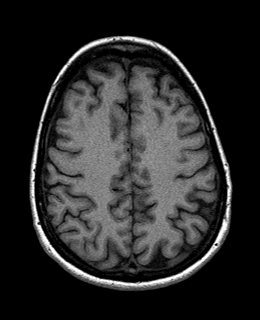
[im 118/144]
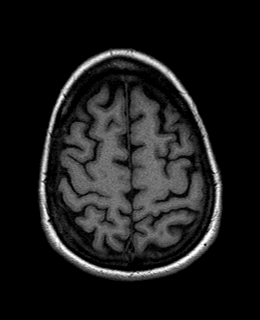
[im 131/144]
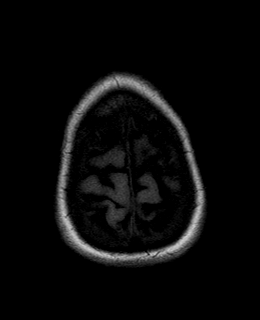
[im 144/144]
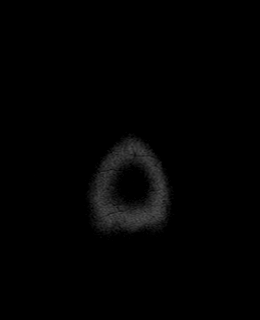

[Series 12: T2 · coronal · 5.0mm · 0.45mm/px · 2 of 26 slices shown (1 of 3)]
[im 1/26]
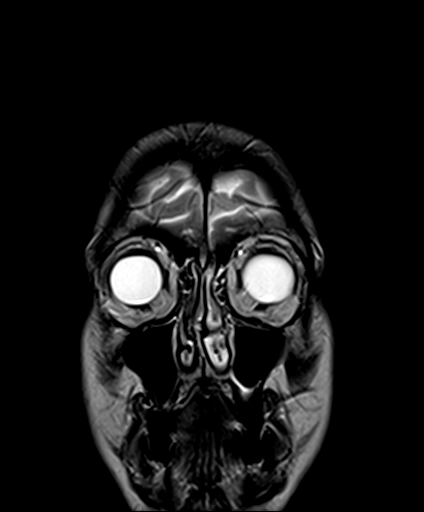
[im 26/26]
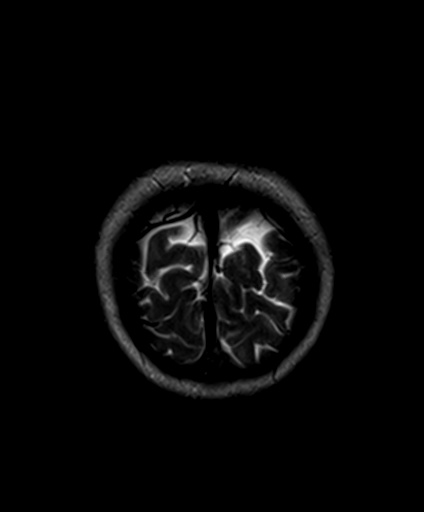

[Series 15: T2 · sagittal · 3.3mm · 0.41mm/px · 1 of 12 slices shown (2 of 3)]
[im 1/12]
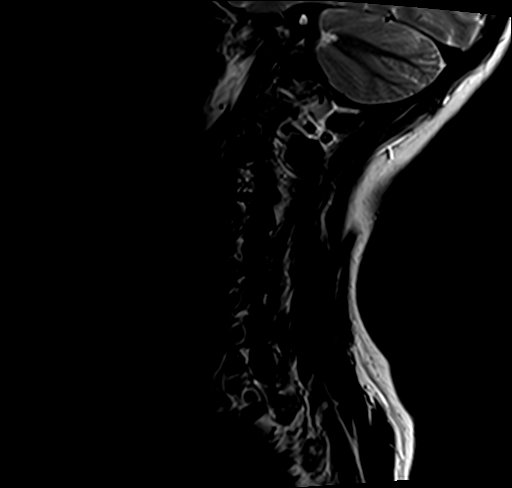

[Series 16: T1 · sagittal · 3.3mm · 0.41mm/px · 1 of 12 slices shown]
[im 1/12]
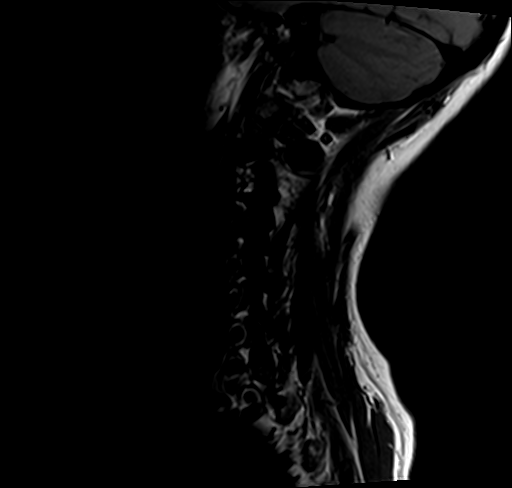

[Series 17: tir sag · sagittal · 3.3mm · 0.41mm/px · 1 of 12 slices shown]
[im 1/12]
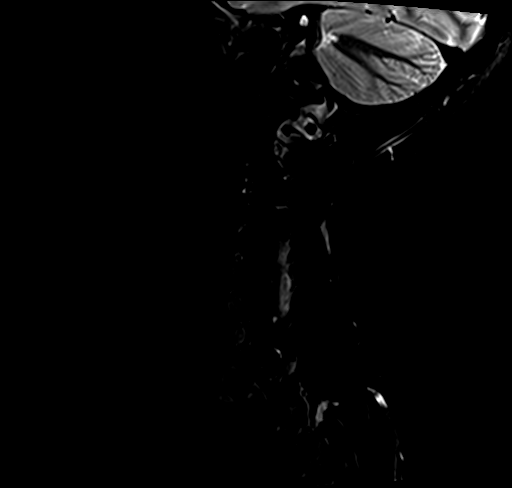

[Series 18: GRE · axial · 3.0mm · 0.35mm/px · z∈[-199,-114]mm · 2 of 24 slices shown]
[im 1/24]
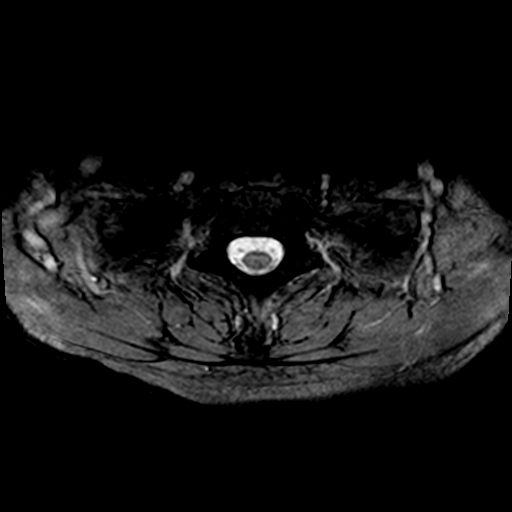
[im 24/24]
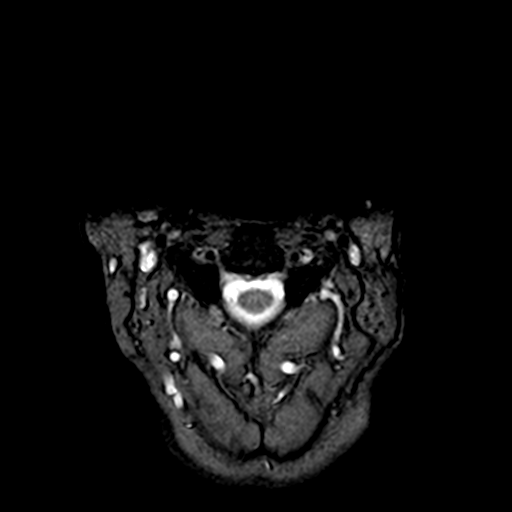

[Series 19: T2 · axial · 3.0mm · 0.70mm/px · z∈[-199,-114]mm · 2 of 24 slices shown (3 of 3)]
[im 1/24]
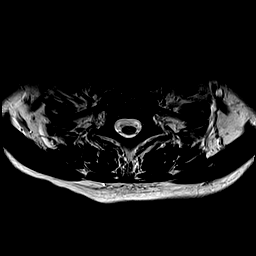
[im 24/24]
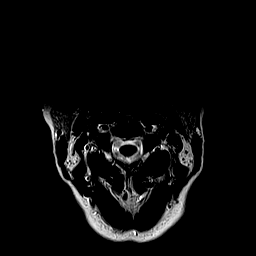

[48 of 48 positions shown; findings below may reference images not displayed]

FINDINGS: MRI HEAD FINDINGS

Brain: There is no evidence of acute infarct, intracranial
hemorrhage, mass, midline shift, or extra-axial fluid collection.
The ventricles and sulci are normal. The brain is normal in signal.

Vascular: Major intracranial vascular flow voids are preserved.

Skull and upper cervical spine: Unremarkable bone marrow signal.

Sinuses/Orbits: Unremarkable orbits. Paranasal sinuses and mastoid
air cells are clear.

Other: Small mucous retention cysts in the nasopharynx.

MRI CERVICAL SPINE FINDINGS

Alignment: Cervical spine straightening.  No significant listhesis.

Vertebrae: No fracture, suspicious osseous lesion, or significant
marrow edema.

Cord: Normal signal and morphology.

Posterior Fossa, vertebral arteries, paraspinal tissues:
Unremarkable.

Disc levels: A shallow left central disc protrusion at C5-6 does not
result in stenosis or cord compression. The other disc levels are
unremarkable.
IMPRESSION: 1. Negative brain MRI.
2. Small C5-6 disc protrusion without stenosis.
3. Normal appearance of the cervical spinal cord.

## 2019-03-12 DIAGNOSIS — Z1231 Encounter for screening mammogram for malignant neoplasm of breast: Secondary | ICD-10-CM | POA: Diagnosis not present

## 2022-06-08 ENCOUNTER — Encounter: Payer: Self-pay | Admitting: Gastroenterology
